# Patient Record
Sex: Male | Born: 1980 | Race: White | Hispanic: No | Smoking: Heavy tobacco smoker
Health system: Southern US, Community
[De-identification: ages and names within clinical notes are randomized; demographics above are authoritative.]

## PROBLEM LIST (undated history)

## (undated) DIAGNOSIS — Z789 Other specified health status: Secondary | ICD-10-CM

## (undated) DIAGNOSIS — K219 Gastro-esophageal reflux disease without esophagitis: Secondary | ICD-10-CM

## (undated) HISTORY — PX: NO PAST SURGERIES: SHX2092

---
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
## 2001-01-07 ENCOUNTER — Emergency Department (HOSPITAL_COMMUNITY): Admission: EM | Admit: 2001-01-07 | Discharge: 2001-01-07 | Payer: Self-pay | Admitting: Emergency Medicine

## 2001-01-12 ENCOUNTER — Emergency Department (HOSPITAL_COMMUNITY): Admission: EM | Admit: 2001-01-12 | Discharge: 2001-01-12 | Payer: Self-pay

## 2001-07-06 ENCOUNTER — Encounter: Payer: Self-pay | Admitting: Emergency Medicine

## 2001-07-06 ENCOUNTER — Emergency Department (HOSPITAL_COMMUNITY): Admission: EM | Admit: 2001-07-06 | Discharge: 2001-07-06 | Payer: Self-pay | Admitting: Emergency Medicine

## 2001-10-26 ENCOUNTER — Emergency Department (HOSPITAL_COMMUNITY): Admission: EM | Admit: 2001-10-26 | Discharge: 2001-10-26 | Payer: Self-pay | Admitting: Emergency Medicine

## 2001-11-05 ENCOUNTER — Emergency Department (HOSPITAL_COMMUNITY): Admission: EM | Admit: 2001-11-05 | Discharge: 2001-11-05 | Payer: Self-pay | Admitting: Emergency Medicine

## 2002-03-18 ENCOUNTER — Emergency Department (HOSPITAL_COMMUNITY): Admission: EM | Admit: 2002-03-18 | Discharge: 2002-03-18 | Payer: Self-pay | Admitting: Emergency Medicine

## 2002-03-18 ENCOUNTER — Encounter: Payer: Self-pay | Admitting: Emergency Medicine

## 2002-06-26 ENCOUNTER — Emergency Department (HOSPITAL_COMMUNITY): Admission: EM | Admit: 2002-06-26 | Discharge: 2002-06-26 | Payer: Self-pay

## 2003-02-24 ENCOUNTER — Emergency Department (HOSPITAL_COMMUNITY): Admission: EM | Admit: 2003-02-24 | Discharge: 2003-02-25 | Payer: Self-pay | Admitting: Emergency Medicine

## 2003-05-06 ENCOUNTER — Emergency Department (HOSPITAL_COMMUNITY): Admission: EM | Admit: 2003-05-06 | Discharge: 2003-05-06 | Payer: Self-pay | Admitting: Emergency Medicine

## 2003-07-10 ENCOUNTER — Emergency Department (HOSPITAL_COMMUNITY): Admission: EM | Admit: 2003-07-10 | Discharge: 2003-07-11 | Payer: Self-pay

## 2003-07-12 ENCOUNTER — Emergency Department (HOSPITAL_COMMUNITY): Admission: EM | Admit: 2003-07-12 | Discharge: 2003-07-12 | Payer: Self-pay | Admitting: Emergency Medicine

## 2003-07-22 ENCOUNTER — Emergency Department (HOSPITAL_COMMUNITY): Admission: EM | Admit: 2003-07-22 | Discharge: 2003-07-22 | Payer: Self-pay | Admitting: Emergency Medicine

## 2004-04-29 ENCOUNTER — Emergency Department (HOSPITAL_COMMUNITY): Admission: EM | Admit: 2004-04-29 | Discharge: 2004-04-29 | Payer: Self-pay | Admitting: Emergency Medicine

## 2005-05-04 ENCOUNTER — Emergency Department (HOSPITAL_COMMUNITY): Admission: EM | Admit: 2005-05-04 | Discharge: 2005-05-04 | Payer: Self-pay | Admitting: Emergency Medicine

## 2006-10-12 ENCOUNTER — Emergency Department (HOSPITAL_COMMUNITY): Admission: EM | Admit: 2006-10-12 | Discharge: 2006-10-12 | Payer: Self-pay | Admitting: Emergency Medicine

## 2012-04-20 ENCOUNTER — Observation Stay (HOSPITAL_COMMUNITY)
Admission: EM | Admit: 2012-04-20 | Discharge: 2012-04-21 | Disposition: A | Discharge status: Home / Self Care | Payer: Self-pay | Attending: Internal Medicine | Admitting: Internal Medicine

## 2012-04-20 ENCOUNTER — Emergency Department (HOSPITAL_COMMUNITY): Payer: Self-pay

## 2012-04-20 ENCOUNTER — Encounter (HOSPITAL_COMMUNITY): Payer: Self-pay | Admitting: Emergency Medicine

## 2012-04-20 DIAGNOSIS — K219 Gastro-esophageal reflux disease without esophagitis: Principal | ICD-10-CM

## 2012-04-20 DIAGNOSIS — F1120 Opioid dependence, uncomplicated: Secondary | ICD-10-CM | POA: Insufficient documentation

## 2012-04-20 DIAGNOSIS — Z23 Encounter for immunization: Secondary | ICD-10-CM | POA: Insufficient documentation

## 2012-04-20 DIAGNOSIS — K259 Gastric ulcer, unspecified as acute or chronic, without hemorrhage or perforation: Secondary | ICD-10-CM

## 2012-04-20 DIAGNOSIS — R109 Unspecified abdominal pain: Secondary | ICD-10-CM

## 2012-04-20 DIAGNOSIS — R1013 Epigastric pain: Secondary | ICD-10-CM | POA: Insufficient documentation

## 2012-04-20 HISTORY — DX: Other specified health status: Z78.9

## 2012-04-20 LAB — CBC WITH DIFFERENTIAL/PLATELET
Basophils Absolute: 0 10*3/uL (ref 0.0–0.1)
Eosinophils Absolute: 0 10*3/uL (ref 0.0–0.7)
Eosinophils Relative: 0 % (ref 0–5)
Lymphocytes Relative: 2 % — ABNORMAL LOW (ref 12–46)
MCV: 93.4 fL (ref 78.0–100.0)
Neutrophils Relative %: 94 % — ABNORMAL HIGH (ref 43–77)
Platelets: 243 10*3/uL (ref 150–400)
RBC: 5.27 MIL/uL (ref 4.22–5.81)
RDW: 13.2 % (ref 11.5–15.5)
WBC: 21.9 10*3/uL — ABNORMAL HIGH (ref 4.0–10.5)

## 2012-04-20 LAB — COMPREHENSIVE METABOLIC PANEL WITH GFR
ALT: 21 U/L (ref 0–53)
AST: 22 U/L (ref 0–37)
Albumin: 4.3 g/dL (ref 3.5–5.2)
Alkaline Phosphatase: 69 U/L (ref 39–117)
BUN: 18 mg/dL (ref 6–23)
CO2: 25 meq/L (ref 19–32)
Calcium: 10 mg/dL (ref 8.4–10.5)
Chloride: 99 meq/L (ref 96–112)
Creatinine, Ser: 0.78 mg/dL (ref 0.50–1.35)
GFR calc Af Amer: >90 mL/min
GFR calc non Af Amer: >90 mL/min
Glucose, Bld: 137 mg/dL — ABNORMAL HIGH (ref 70–99)
Potassium: 4 meq/L (ref 3.5–5.1)
Sodium: 141 meq/L (ref 135–145)
Total Bilirubin: 1 mg/dL (ref 0.3–1.2)
Total Protein: 7.8 g/dL (ref 6.0–8.3)

## 2012-04-20 LAB — POCT I-STAT TROPONIN I: Troponin i, poc: 0 ng/mL (ref 0.00–0.08)

## 2012-04-20 LAB — MRSA PCR SCREENING: MRSA by PCR: NEGATIVE

## 2012-04-20 MED ORDER — PANTOPRAZOLE SODIUM 40 MG IV SOLR
40.0000 mg | Freq: Once | INTRAVENOUS | Status: AC
  Administered 2012-04-20: 40 mg via INTRAVENOUS
  Filled 2012-04-20: qty 40

## 2012-04-20 MED ORDER — ONDANSETRON HCL 4 MG/2ML IJ SOLN
4.0000 mg | Freq: Once | INTRAMUSCULAR | Status: AC
  Administered 2012-04-20: 4 mg via INTRAVENOUS
  Filled 2012-04-20: qty 2

## 2012-04-20 MED ORDER — SODIUM CHLORIDE 0.9 % IV SOLN
8.0000 mg/h | INTRAVENOUS | Status: DC
  Administered 2012-04-20: 8 mg/h via INTRAVENOUS
  Filled 2012-04-20 (×3): qty 80

## 2012-04-20 MED ORDER — SODIUM CHLORIDE 0.9 % IV BOLUS (SEPSIS)
1000.0000 mL | Freq: Once | INTRAVENOUS | Status: AC
  Administered 2012-04-20: 1000 mL via INTRAVENOUS

## 2012-04-20 MED ORDER — INFLUENZA VIRUS VACC SPLIT PF IM SUSP
0.5000 mL | INTRAMUSCULAR | Status: AC
  Administered 2012-04-21: 0.5 mL via INTRAMUSCULAR
  Filled 2012-04-20: qty 0.5

## 2012-04-20 MED ORDER — METHADONE HCL 10 MG PO TABS
50.0000 mg | ORAL_TABLET | Freq: Every day | ORAL | Status: DC
  Administered 2012-04-21: 50 mg via ORAL
  Filled 2012-04-20: qty 5

## 2012-04-20 MED ORDER — HYDROMORPHONE HCL PF 1 MG/ML IJ SOLN
1.0000 mg | Freq: Once | INTRAMUSCULAR | Status: AC
  Administered 2012-04-20: 1 mg via INTRAVENOUS
  Filled 2012-04-20: qty 1

## 2012-04-20 MED ORDER — IOHEXOL 300 MG/ML  SOLN
100.0000 mL | Freq: Once | INTRAMUSCULAR | Status: AC | PRN
  Administered 2012-04-20: 100 mL via INTRAVENOUS

## 2012-04-20 MED ORDER — IOHEXOL 300 MG/ML  SOLN
20.0000 mL | INTRAMUSCULAR | Status: AC
  Administered 2012-04-20 (×2): 25 mL via ORAL

## 2012-04-20 MED ORDER — PNEUMOCOCCAL VAC POLYVALENT 25 MCG/0.5ML IJ INJ
0.5000 mL | INJECTION | INTRAMUSCULAR | Status: AC
  Administered 2012-04-21: 0.5 mL via INTRAMUSCULAR
  Filled 2012-04-20: qty 0.5

## 2012-04-20 NOTE — ED Notes (Signed)
Upper abd pain and cp  X 1 year worse since this am denies dysuria has been vomiting today he states

## 2012-04-20 NOTE — ED Provider Notes (Addendum)
History     CSN: [id number]  Arrival date & time 04/20/12  1242   First MD Initiated Contact with Patient 04/20/12 1454      No chief complaint on file.   (Consider location/radiation/quality/duration/timing/severity/associated sxs/prior treatment) Patient is a 32 y.o. male presenting with abdominal pain. The history is provided by the patient (the pt complains of abd pain and vomiting).  Abdominal Pain The primary symptoms of the illness include abdominal pain. The primary symptoms of the illness do not include fatigue or diarrhea. The current episode started 13 to 24 hours ago. The onset of the illness was sudden. The problem has not changed since onset. Associated with: nothing. The patient states that she believes she is currently not pregnant. The patient has not had a change in bowel habit. Symptoms associated with the illness do not include chills, hematuria, frequency or back pain. Significant associated medical issues do not include gallstones.    History reviewed. No pertinent past medical history.  No past surgical history on file.  No family history on file.  History  Substance Use Topics  . Smoking status: Heavy Tobacco Smoker  . Smokeless tobacco: Not on file  . Alcohol Use: Yes      Review of Systems  Constitutional: Negative for chills and fatigue.  HENT: Negative for congestion, sinus pressure and ear discharge.   Eyes: Negative for discharge.  Respiratory: Negative for cough.   Cardiovascular: Negative for chest pain.  Gastrointestinal: Positive for abdominal pain. Negative for diarrhea.  Genitourinary: Negative for frequency and hematuria.  Musculoskeletal: Negative for back pain.  Skin: Negative for rash.  Neurological: Negative for seizures and headaches.  Hematological: Negative.   Psychiatric/Behavioral: Negative for hallucinations.    Allergies  Review of patient's allergies indicates not on file.  Home Medications   Current Outpatient Rx    Name  Route  Sig  Dispense  Refill  . COUGH RELIEF PO   Oral   Take 2 tablets by mouth once.         . METHADONE HCL PO   Oral   Take 1 tablet by mouth daily.           BP 101/55  Pulse 83  Temp 98.8 F (37.1 C)  Resp 15  SpO2 100%  Physical Exam  Constitutional: He is oriented to person, place, and time. He appears well-developed.  HENT:  Head: Normocephalic and atraumatic.  Eyes: Conjunctivae normal and EOM are normal. No scleral icterus.  Neck: Neck supple. No thyromegaly present.  Cardiovascular: Normal rate and regular rhythm.  Exam reveals no gallop and no friction rub.   No murmur heard. Pulmonary/Chest: No stridor. He has no wheezes. He has no rales. He exhibits no tenderness.  Abdominal: He exhibits no distension. There is tenderness. There is no rebound.       Severe epigastric tenderness  Musculoskeletal: Normal range of motion. He exhibits no edema.  Lymphadenopathy:    He has no cervical adenopathy.  Neurological: He is oriented to person, place, and time. Coordination normal.  Skin: No rash noted. No erythema.  Psychiatric: He has a normal mood and affect. His behavior is normal.    ED Course  Procedures (including critical care time)  Labs Reviewed  CBC WITH DIFFERENTIAL - Abnormal; Notable for the following:    WBC 21.9 (*)     Neutrophils Relative 94 (*)     Neutro Abs 20.5 (*)     Lymphocytes Relative 2 (*)  Lymphs Abs 0.5 (*)     All other components within normal limits  COMPREHENSIVE METABOLIC PANEL - Abnormal; Notable for the following:    Glucose, Bld 137 (*)     All other components within normal limits  LIPASE, BLOOD  POCT I-STAT TROPONIN I   Dg Chest 2 View  04/20/2012  *RADIOLOGY REPORT*  Clinical Data: Chest and upper abdominal pain  CHEST - 2 VIEW  Comparison: None.  Findings: Normal heart size.  Clear lungs.  Mild bronchitic changes.  No pneumothorax.  No pleural effusion. T9-10 disc space is hypoplastic.  IMPRESSION: No  active cardiopulmonary disease.   Original Report Authenticated By: Jolaine Click, M.D.    Ct Abdomen Pelvis W Contrast  04/20/2012  *RADIOLOGY REPORT*  Clinical Data: Pain  CT ABDOMEN AND PELVIS WITH CONTRAST  Technique:  Multidetector CT imaging of the abdomen and pelvis was performed following the standard protocol during bolus administration of intravenous contrast.  Contrast: OMNIPAQUE IOHEXOL 300 MG/ML  SOLN  Comparison: None  Findings: Lung bases appear clear.  No pericardial or pleural effusion.  Normal appearance of the liver.  No focal liver abnormality.  The gallbladder appears within normal limits.  No biliary dilatation. The pancreas is normal.  The spleen is negative.  The adrenal glands are within normal limits.  Normal appearance of the right kidney.  The left kidney is normal.  Urinary bladder is normal.  Prostate gland and seminal vesicles appear normal.  The abdominal aorta has a normal caliber.  No aneurysm.  There is no upper abdominal adenopathy identified.  There is no pelvic or inguinal adenopathy.  The stomach is normal. The small bowel loops are unremarkable.  No obstruction.  The appendix is difficult to visualize separate from the right lower quadrant bowel loops.  No secondary signs of acute appendicitis.  Normal appearance of the colon.  No free fluid or abnormal fluid collection identified.  Review of the visualized osseous structures is unremarkable.  IMPRESSION:  1.  No acute findings identified within the abdomen or pelvis. 2.  Nonvisualization of the appendix but no secondary signs of acute appendicitis.   Original Report Authenticated By: Signa Kell, M.D.      1. Abdominal pain       MDM      Date: 04/20/2012  Rate: 101  Rhythm: sinus tachycardia  QRS Axis: normal  Intervals: normal  ST/T Wave abnormalities: normal  Conduction Disutrbances:none  Narrative Interpretation:   Old EKG Reviewed: none available       Benny Lennert, MD 04/20/12  1923  Benny Lennert, MD 04/20/12 1924

## 2012-04-20 NOTE — Progress Notes (Signed)
  Pt admitted to the unit. Pt is stable, alert and oriented per baseline. Oriented to room, staff, and call bell. Educated to call for any assistance. Bed in lowest position, call bell within reach- will continue to monitor. 

## 2012-04-20 NOTE — H&P (Signed)
[facility] History and Physical  SHEM PLEMMONS ZOX:[id number] DOB: 12/10/1980 DOA: 04/20/2012  Referring physician: ED PCP: Pcp Not In System  Specialists: None  Chief Complaint: Abdominal pain  HPI: Walter Miller is a 32 y.o. male who presents to the ED with c/o abdominal pain, located in his epigastric area.  Worst first thing in the morning with severe burning sensation up all the way to his throat.  Swallowing first thing in the morning makes it significantly worse as does lying down.  Sitting up seems to make the burning sensation better.  This pain has been going on for the past 1 year but significantly worse today.  In the ED, ct scan of his abdomen was negative, ED felt however that he should be admitted for concern of gastric ulcer and acid reflux pain control.  Review of Systems: 12 systems reviewed and otherwise negative  History reviewed. No pertinent past medical history. No past surgical history on file. Social History:  reports that he has been smoking.  He does not have any smokeless tobacco history on file. He reports that he drinks alcohol. His drug history not on file. Is on chronic methadone from methadone clinic  Not on File  No family history on file.   Prior to Admission medications   Medication Sig Start Date End Date Taking? Authorizing Provider  Dextromethorphan HBr (COUGH RELIEF PO) Take 2 tablets by mouth once.   Yes Historical Provider, MD  METHADONE HCL PO Take 1 tablet by mouth daily.   Yes Historical Provider, MD   Physical Exam: Filed Vitals:   04/20/12 1645 04/20/12 1700 04/20/12 1830 04/20/12 1900  BP: 113/49 101/67 131/65 101/55  Pulse: 95 85 80 83  Temp:      Resp: 19 18 16 15   SpO2: 100% 97% 100% 100%    General:  NAD, resting comfortably in bed Eyes: PEERLA EOMI ENT: mucous membranes moist Neck: supple w/o JVD Cardiovascular: RRR w/o MRG Respiratory: CTA B Abdomen: soft, some epigastric tenderness but no guarding, no  rebound, nd, bs+ Skin: no rash nor lesion Musculoskeletal: MAE, full ROM all 4 extremities Psychiatric: normal tone and affect Neurologic: AAOx3, grossly non-focal  Labs on Admission:  Basic Metabolic Panel:  Lab 04/20/12 8119  NA 141  K 4.0  CL 99  CO2 25  GLUCOSE 137*  BUN 18  CREATININE 0.78  CALCIUM 10.0  MG --  PHOS --   Liver Function Tests:  Lab 04/20/12 1249  AST 22  ALT 21  ALKPHOS 69  BILITOT 1.0  PROT 7.8  ALBUMIN 4.3    Lab 04/20/12 1249  LIPASE 41  AMYLASE --   No results found for this basename: AMMONIA:5 in the last 168 hours CBC:  Lab 04/20/12 1249  WBC 21.9*  NEUTROABS 20.5*  HGB 16.9  HCT 49.2  MCV 93.4  PLT 243   Cardiac Enzymes: No results found for this basename: CKTOTAL:5,CKMB:5,CKMBINDEX:5,TROPONINI:5 in the last 168 hours  BNP (last 3 results) No results found for this basename: PROBNP:3 in the last 8760 hours CBG: No results found for this basename: GLUCAP:5 in the last 168 hours  Radiological Exams on Admission: Dg Chest 2 View  04/20/2012  *RADIOLOGY REPORT*  Clinical Data: Chest and upper abdominal pain  CHEST - 2 VIEW  Comparison: None.  Findings: Normal heart size.  Clear lungs.  Mild bronchitic changes.  No pneumothorax.  No pleural effusion. T9-10 disc space is hypoplastic.  IMPRESSION: No active cardiopulmonary disease.  Original Report Authenticated By: Jolaine Click, MillerD.    Ct Abdomen Pelvis W Contrast  04/20/2012  *RADIOLOGY REPORT*  Clinical Data: Pain  CT ABDOMEN AND PELVIS WITH CONTRAST  Technique:  Multidetector CT imaging of the abdomen and pelvis was performed following the standard protocol during bolus administration of intravenous contrast.  Contrast: OMNIPAQUE IOHEXOL 300 MG/ML  SOLN  Comparison: None  Findings: Lung bases appear clear.  No pericardial or pleural effusion.  Normal appearance of the liver.  No focal liver abnormality.  The gallbladder appears within normal limits.  No biliary dilatation.  The pancreas is normal.  The spleen is negative.  The adrenal glands are within normal limits.  Normal appearance of the right kidney.  The left kidney is normal.  Urinary bladder is normal.  Prostate gland and seminal vesicles appear normal.  The abdominal aorta has a normal caliber.  No aneurysm.  There is no upper abdominal adenopathy identified.  There is no pelvic or inguinal adenopathy.  The stomach is normal. The small bowel loops are unremarkable.  No obstruction.  The appendix is difficult to visualize separate from the right lower quadrant bowel loops.  No secondary signs of acute appendicitis.  Normal appearance of the colon.  No free fluid or abnormal fluid collection identified.  Review of the visualized osseous structures is unremarkable.  IMPRESSION:  1.  No acute findings identified within the abdomen or pelvis. 2.  Nonvisualization of the appendix but no secondary signs of acute appendicitis.   Original Report Authenticated By: Signa Kell, MillerD.     EKG: Independently reviewed.  Assessment/Plan Principal Problem:  *Gastric ulcer Active Problems:  GERD (gastroesophageal reflux disease)   1. GERD - classic GERD symptoms, seems to have improved somewhat with 40 protonix IV, will go ahead and put on PPI GTT for now to try and get pain under control since he isnt really responding much to the dilaudid probably because he has a high tolerance for narcotics (on methadone). 2. Gastric ulcer - again putting on PPI GTT, likely needs long term PPI, may need GI consultation for endoscopy.  Getting all of this set up as an outpatient is likely to be his biggest issue since he dosent have insurance.    Code Status: Full Code (must indicate code status--if unknown or must be presumed, indicate so) Family Communication: No family in room (indicate person spoken with, if applicable, with phone number if by telephone) Disposition Plan: Admit to obs (indicate anticipated LOS)  Time spent: 50  min  Walter Tye M. [facility] Pager [phone]  If 7PM-7AM, please contact night-coverage [email or web address] Password [facility] 04/20/2012, 8:38 PM

## 2012-04-21 LAB — CBC
HCT: 41 % (ref 39.0–52.0)
Hemoglobin: 14.2 g/dL (ref 13.0–17.0)
WBC: 11 10*3/uL — ABNORMAL HIGH (ref 4.0–10.5)

## 2012-04-21 LAB — BASIC METABOLIC PANEL
BUN: 15 mg/dL (ref 6–23)
CO2: 27 mEq/L (ref 19–32)
Chloride: 103 mEq/L (ref 96–112)
GFR calc Af Amer: >90 mL/min (ref >90)
Glucose, Bld: 101 mg/dL — ABNORMAL HIGH (ref 70–99)
Potassium: 3.5 mEq/L (ref 3.5–5.1)

## 2012-04-21 MED ORDER — HYDROMORPHONE HCL PF 1 MG/ML IJ SOLN
1.0000 mg | INTRAMUSCULAR | Status: DC | PRN
  Administered 2012-04-21 (×2): 1 mg via INTRAVENOUS
  Filled 2012-04-21 (×2): qty 1

## 2012-04-21 MED ORDER — PANTOPRAZOLE SODIUM 40 MG PO TBEC: 40.0000 mg | DELAYED_RELEASE_TABLET | Freq: Every day | ORAL | Status: DC

## 2012-04-21 NOTE — Progress Notes (Signed)
Nsg Discharge Note  Admit Date:  04/20/2012 Discharge date: 04/21/2012   Phylliss Bob to be D/C'd Home per MD order.  AVS completed.  Copy for chart, and copy for patient signed, and dated. Patient/caregiver able to verbalize understanding.  Discharge Medication:  Ermias, Tomeo  Home Medication Instructions AOZ:[id number]   Printed on:04/21/12 1415  Medication Information                    METHADONE HCL PO Take 1 tablet by mouth daily.           pantoprazole (PROTONIX) 40 MG tablet Take 1 tablet (40 mg total) by mouth daily.             Discharge Assessment: Filed Vitals:   04/21/12 0541  BP: 98/62  Pulse: 72  Temp: 99 F (37.2 C)  Resp: 20   Skin clean, dry and intact without evidence of skin break down, no evidence of skin tears noted. IV catheter discontinued intact. Site without signs and symptoms of complications - no redness or edema noted at insertion site, patient denies c/o pain - only slight tenderness at site.  Dressing with slight pressure applied.  D/c Instructions-Education: Discharge instructions given to patient/family with verbalized understanding. D/c education completed with patient/family including follow up instructions, medication list, d/c activities limitations if indicated, with other d/c instructions as indicated by MD - patient able to verbalize understanding, all questions fully answered. Patient instructed to return to ED, call 911, or call MD for any changes in condition.  Patient escorted via WC, and D/C home via private auto.  Bethel Gaglio Consuella Lose, RN 04/21/2012 2:15 PM

## 2012-04-21 NOTE — Discharge Summary (Signed)
Physician Discharge Summary  Walter Miller XLK:[id number] DOB: [birth year] DOA: 04/20/2012  PCP: [facility]  Admit date: 04/20/2012 Discharge date: 04/21/2012  Time spent: 40 minutes  Recommendations for Outpatient Follow-up:  1. Followup with primary care physician methadone clinic  Discharge Diagnoses:  Principal Problem:  *Gastric ulcer Active Problems:  GERD (gastroesophageal reflux disease)   Discharge Condition: Stable  Diet recommendation: Regular food, avoid acidic food  Filed Weights   04/20/12 2251  Weight: 89.2 kg (196 lb 10.4 oz)    History of present illness:  Walter Miller is a 32 y.o. male who presents to the ED with c/o abdominal pain, located in his epigastric area. Worst first thing in the morning with severe burning sensation up all the way to his throat. Swallowing first thing in the morning makes it significantly worse as does lying down. Sitting up seems to make the burning sensation better. This pain has been going on for the past 1 year but significantly worse today.  In the ED, ct scan of his abdomen was negative, ED felt however that he should be admitted for concern of gastric ulcer and acid reflux pain control.   Hospital Course:   1. Abdominal pain: Patient admitted to the hospital because of classic GERD symptoms, patient did have abdominal pain, dyspepsia and heartburn as well as sour taste in his mouth in the morning. There was concern about gastric ulcer, patient denies any hematemesis, denies any colored stools. I curb sided [place] [facility] and Dr. Christella Hartigan recommended Protonix once a day for the foreseeable future. Patient should followup with primary care physician, if he continues to have GERD symptoms he might need evaluation by GI as outpatient with scope.  2. GERD: As discussed above, Protonix by mouth daily, avoid acidic food and NSAIDs. Follow up with primary care physician, potential referral to GI as outpatient if  continues to have symptoms.  3. Narcotics dependence: Patient is on methadone, he falls with [facility], his and 55 mg per him, he is on methadone to keep him off of street opioids. He is been on methadone for 2 years, I wonder if it is the time to wean him off of the methadone. He follows with methadone clinic.  Procedures:  None  Consultations:  None  Discharge Exam: Filed Vitals:   04/20/12 2048 04/20/12 2114 04/20/12 2251 04/21/12 0541  BP: 115/48 109/63 123/66 98/62  Pulse: 80 92 68 72  Temp: 99 F (37.2 C) 99.4 F (37.4 C) 99.5 F (37.5 C) 99 F (37.2 C)  TempSrc: Oral Oral Oral Oral  Resp: 19 20 18 20   Height:   5\' 10"  (1.778 m)   Weight:   89.2 kg (196 lb 10.4 oz)   SpO2: 100% 100% 100% 100%   General: Alert and awake, oriented x3, not in any acute distress. HEENT: anicteric sclera, pupils reactive to light and accommodation, EOMI CVS: S1-S2 clear, no murmur rubs or gallops Chest: clear to auscultation bilaterally, no wheezing, rales or rhonchi Abdomen: soft nontender, nondistended, normal bowel sounds, no organomegaly Extremities: no cyanosis, clubbing or edema noted bilaterally Neuro: Cranial nerves II-XII intact, no focal neurological deficits  Discharge Instructions     Medication List     As of 04/21/2012 11:02 AM    STOP taking these medications         COUGH RELIEF PO      TAKE these medications         METHADONE HCL PO  Take 1 tablet by mouth daily.      pantoprazole 40 MG tablet   Commonly known as: PROTONIX   Take 1 tablet (40 mg total) by mouth daily.          The results of significant diagnostics from this hospitalization (including imaging, microbiology, ancillary and laboratory) are listed below for reference.    Significant Diagnostic Studies: Dg Chest 2 View  04/20/2012  *RADIOLOGY REPORT*  Clinical Data: Chest and upper abdominal pain  CHEST - 2 VIEW  Comparison: None.  Findings: Normal heart size.  Clear lungs.   Mild bronchitic changes.  No pneumothorax.  No pleural effusion. T9-10 disc space is hypoplastic.  IMPRESSION: No active cardiopulmonary disease.   Original Report Authenticated By: Jolaine Click, M.D.    Ct Abdomen Pelvis W Contrast  04/20/2012  *RADIOLOGY REPORT*  Clinical Data: Pain  CT ABDOMEN AND PELVIS WITH CONTRAST  Technique:  Multidetector CT imaging of the abdomen and pelvis was performed following the standard protocol during bolus administration of intravenous contrast.  Contrast: OMNIPAQUE IOHEXOL 300 MG/ML  SOLN  Comparison: None  Findings: Lung bases appear clear.  No pericardial or pleural effusion.  Normal appearance of the liver.  No focal liver abnormality.  The gallbladder appears within normal limits.  No biliary dilatation. The pancreas is normal.  The spleen is negative.  The adrenal glands are within normal limits.  Normal appearance of the right kidney.  The left kidney is normal.  Urinary bladder is normal.  Prostate gland and seminal vesicles appear normal.  The abdominal aorta has a normal caliber.  No aneurysm.  There is no upper abdominal adenopathy identified.  There is no pelvic or inguinal adenopathy.  The stomach is normal. The small bowel loops are unremarkable.  No obstruction.  The appendix is difficult to visualize separate from the right lower quadrant bowel loops.  No secondary signs of acute appendicitis.  Normal appearance of the colon.  No free fluid or abnormal fluid collection identified.  Review of the visualized osseous structures is unremarkable.  IMPRESSION:  1.  No acute findings identified within the abdomen or pelvis. 2.  Nonvisualization of the appendix but no secondary signs of acute appendicitis.   Original Report Authenticated By: Signa Kell, M.D.     Microbiology: Recent Results (from the past 240 hour(s))  MRSA PCR SCREENING     Status: Normal   Collection Time   04/20/12 10:26 PM      Component Value Range Status Comment   MRSA by PCR  NEGATIVE  NEGATIVE Final      Labs: Basic Metabolic Panel:  Lab 04/21/12 1610 04/20/12 1249  NA 139 141  K 3.5 4.0  CL 103 99  CO2 27 25  GLUCOSE 101* 137*  BUN 15 18  CREATININE 0.89 0.78  CALCIUM 8.8 10.0  MG -- --  PHOS -- --   Liver Function Tests:  Lab 04/20/12 1249  AST 22  ALT 21  ALKPHOS 69  BILITOT 1.0  PROT 7.8  ALBUMIN 4.3    Lab 04/20/12 1249  LIPASE 41  AMYLASE --   No results found for this basename: AMMONIA:5 in the last 168 hours CBC:  Lab 04/21/12 0620 04/20/12 1249  WBC 11.0* 21.9*  NEUTROABS -- 20.5*  HGB 14.2 16.9  HCT 41.0 49.2  MCV 93.2 93.4  PLT 199 243   Cardiac Enzymes: No results found for this basename: CKTOTAL:5,CKMB:5,CKMBINDEX:5,TROPONINI:5 in the last 168 hours BNP: BNP (last 3  results) No results found for this basename: PROBNP:3 in the last 8760 hours CBG: No results found for this basename: GLUCAP:5 in the last 168 hours     Signed:  Marshawn Normoyle A  [facility] 04/21/2012, 11:02 AM

## 2013-01-18 ENCOUNTER — Emergency Department (HOSPITAL_COMMUNITY): Payer: No Typology Code available for payment source

## 2013-01-18 ENCOUNTER — Encounter (HOSPITAL_COMMUNITY): Payer: Self-pay | Admitting: Emergency Medicine

## 2013-01-18 ENCOUNTER — Emergency Department (HOSPITAL_COMMUNITY)
Admission: EM | Admit: 2013-01-18 | Discharge: 2013-01-18 | Disposition: A | Discharge status: Home / Self Care | Payer: No Typology Code available for payment source | Attending: Emergency Medicine | Admitting: Emergency Medicine

## 2013-01-18 DIAGNOSIS — M5432 Sciatica, left side: Secondary | ICD-10-CM

## 2013-01-18 DIAGNOSIS — Y92410 Unspecified street and highway as the place of occurrence of the external cause: Secondary | ICD-10-CM | POA: Insufficient documentation

## 2013-01-18 DIAGNOSIS — R51 Headache: Secondary | ICD-10-CM | POA: Insufficient documentation

## 2013-01-18 DIAGNOSIS — Y9389 Activity, other specified: Secondary | ICD-10-CM | POA: Insufficient documentation

## 2013-01-18 DIAGNOSIS — F17200 Nicotine dependence, unspecified, uncomplicated: Secondary | ICD-10-CM | POA: Insufficient documentation

## 2013-01-18 DIAGNOSIS — M5430 Sciatica, unspecified side: Secondary | ICD-10-CM | POA: Insufficient documentation

## 2013-01-18 DIAGNOSIS — M549 Dorsalgia, unspecified: Secondary | ICD-10-CM

## 2013-01-18 DIAGNOSIS — R209 Unspecified disturbances of skin sensation: Secondary | ICD-10-CM | POA: Insufficient documentation

## 2013-01-18 DIAGNOSIS — Z79899 Other long term (current) drug therapy: Secondary | ICD-10-CM | POA: Insufficient documentation

## 2013-01-18 MED ORDER — KETOROLAC TROMETHAMINE 60 MG/2ML IM SOLN
60.0000 mg | Freq: Once | INTRAMUSCULAR | Status: AC
  Administered 2013-01-18: 60 mg via INTRAMUSCULAR
  Filled 2013-01-18: qty 2

## 2013-01-18 MED ORDER — PREDNISONE 20 MG PO TABS: 40.0000 mg | ORAL_TABLET | Freq: Every day | ORAL | Status: DC

## 2013-01-18 NOTE — ED Notes (Signed)
Seen at [facility] treatment. Currently on Methadone.
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
## 2013-01-20 NOTE — ED Provider Notes (Signed)
Medical screening examination/treatment/procedure(s) were performed by non-physician practitioner and as supervising physician I was immediately available for consultation/collaboration.    Christopher J. Pollina, MD 01/20/13 1556 

## 2013-12-22 ENCOUNTER — Encounter (HOSPITAL_COMMUNITY): Payer: Self-pay | Admitting: Emergency Medicine

## 2013-12-22 ENCOUNTER — Emergency Department (HOSPITAL_COMMUNITY)
Admission: EM | Admit: 2013-12-22 | Discharge: 2013-12-22 | Disposition: A | Discharge status: Home / Self Care | Payer: No Typology Code available for payment source | Attending: Emergency Medicine | Admitting: Emergency Medicine

## 2013-12-22 DIAGNOSIS — R000 Tachycardia, unspecified: Secondary | ICD-10-CM | POA: Insufficient documentation

## 2013-12-22 DIAGNOSIS — F17200 Nicotine dependence, unspecified, uncomplicated: Secondary | ICD-10-CM | POA: Insufficient documentation

## 2013-12-22 DIAGNOSIS — J36 Peritonsillar abscess: Secondary | ICD-10-CM

## 2013-12-22 DIAGNOSIS — R509 Fever, unspecified: Secondary | ICD-10-CM | POA: Insufficient documentation

## 2013-12-22 LAB — RAPID STREP SCREEN (MED CTR MEBANE ONLY): Streptococcus, Group A Screen (Direct): POSITIVE — AB

## 2013-12-22 MED ORDER — OXYCODONE-ACETAMINOPHEN 5-325 MG PO TABS: 1.0000 | ORAL_TABLET | ORAL | Status: DC | PRN

## 2013-12-22 MED ORDER — SODIUM CHLORIDE 0.9 % IV BOLUS (SEPSIS)
1000.0000 mL | Freq: Once | INTRAVENOUS | Status: AC
  Administered 2013-12-22: 1000 mL via INTRAVENOUS

## 2013-12-22 MED ORDER — CLINDAMYCIN HCL 150 MG PO CAPS: 300.0000 mg | ORAL_CAPSULE | Freq: Three times a day (TID) | ORAL | Status: DC

## 2013-12-22 MED ORDER — IBUPROFEN 100 MG/5ML PO SUSP
600.0000 mg | Freq: Once | ORAL | Status: AC
  Administered 2013-12-22: 600 mg via ORAL
  Filled 2013-12-22 (×2): qty 30

## 2013-12-22 MED ORDER — DEXAMETHASONE SODIUM PHOSPHATE 10 MG/ML IJ SOLN
10.0000 mg | Freq: Once | INTRAMUSCULAR | Status: AC
  Administered 2013-12-22: 10 mg via INTRAVENOUS
  Filled 2013-12-22: qty 1

## 2013-12-22 MED ORDER — CLINDAMYCIN PHOSPHATE 600 MG/50ML IV SOLN
600.0000 mg | Freq: Once | INTRAVENOUS | Status: AC
  Administered 2013-12-22: 600 mg via INTRAVENOUS
  Filled 2013-12-22: qty 50

## 2013-12-22 NOTE — ED Notes (Signed)
MD at bedside. EDPA PRESENT TO RE E VALUATE

## 2013-12-22 NOTE — ED Notes (Addendum)
Pt reports with sore throat, and fever x 3 days, sts unable to swallow today

## 2013-12-22 NOTE — ED Provider Notes (Signed)
CSN: [id number]     Arrival date & time 12/22/13  1101 History   First MD Initiated Contact with Patient 12/22/13 1149     Chief Complaint  Patient presents with  . Sore Throat  . Fever   (Consider location/radiation/quality/duration/timing/severity/associated sxs/prior Treatment) HPI Walter Miller is a 33 yo presenting to the ED with c/o sore throat x 3 days.  He reports his children were sick with URI symptoms last week but no one had a sore throat.  It began as a mild sore throat, 3 days ago but has progressively become more painful.  He is also having trouble eating and drinking because of difficulty swallowing.  He describes the pain as constant soreness and rates it as 10/10.  He reports a subjective fever at home accompanied with feeling and hot and cold chills.  He denies cough, shortness of breath, difficulty breathing, abd pain, nausea or vomiting.   Past Medical History  Diagnosis Date  . No pertinent past medical history    Past Surgical History  Procedure Laterality Date  . No past surgeries     No family history on file. History  Substance Use Topics  . Smoking status: Heavy Tobacco Smoker -- 1.00 packs/day  . Smokeless tobacco: Not on file  . Alcohol Use: No    Review of Systems  Constitutional: Positive for fever and chills.  HENT: Positive for sore throat and voice change.   Eyes: Negative for visual disturbance.  Respiratory: Negative for cough and shortness of breath.   Cardiovascular: Negative for chest pain and leg swelling.  Gastrointestinal: Negative for nausea, vomiting and diarrhea.  Genitourinary: Negative for dysuria.  Musculoskeletal: Negative for myalgias.  Skin: Negative for rash.  Neurological: Negative for weakness, numbness and headaches.      Allergies  Review of patient's allergies indicates no known allergies.  Home Medications   Prior to Admission medications   Medication Sig Start Date End Date Taking? Authorizing Provider    BUPRENORPHINE HCL SL Place 12 mg under the tongue daily.   Yes Historical Provider, MD  Phenyleph-Doxylamine-DM-APAP (ALKA SELTZER PLUS PO) Take 1 tablet by mouth every 6 (six) hours as needed (cold symptoms).   Yes Historical Provider, MD   BP 127/73  Pulse 114  Temp(Src) 100.6 F (38.1 C) (Oral)  Resp 16  Ht  (1.753 m)  Wt 195 lb (88.451 kg)  BMI 28.78 kg/m2  SpO2 97% Physical Exam  Nursing note and vitals reviewed. Constitutional: He appears well-developed and well-nourished. No distress.  HENT:  Head: Normocephalic and atraumatic.  Mouth/Throat: Oropharyngeal exudate, posterior oropharyngeal edema, posterior oropharyngeal erythema and tonsillar abscesses present.    Eyes: Conjunctivae are normal.  Neck: Erythema present. No thyromegaly present.    Cardiovascular: Regular rhythm and intact distal pulses.  Tachycardia present.  Exam reveals no gallop and no friction rub.   No murmur heard. Pulmonary/Chest: Effort normal and breath sounds normal. No respiratory distress. He has no decreased breath sounds. He has no wheezes. He has no rhonchi. He has no rales. He exhibits no tenderness.  Abdominal: Soft. There is no tenderness.  Musculoskeletal: He exhibits no tenderness.  Lymphadenopathy:       Head (right side): Tonsillar adenopathy present.       Head (left side): Tonsillar adenopathy present.    He has cervical adenopathy.  Neurological: He is alert.  Skin: Skin is warm and dry. No rash noted. He is not diaphoretic.  Psychiatric: He has a normal mood  and affect.    ED Course  Procedures (including critical care time) Labs Review Labs Reviewed  RAPID STREP SCREEN - Abnormal; Notable for the following:    Streptococcus, Group A Screen (Direct) POSITIVE (*)    All other components within normal limits   Imaging Review No results found.   EKG Interpretation None      MDM   Final diagnoses:  Peritonsillar abscess   33 yo male with fever, sore throat,  positive strep, bilat enlarged tonsils, tonsillar exudate, cervical lymphadenopathy, & dysphagia.  Left significantly larger with uvula deviation. Strep swab positive. Concern for peritonsillar abscess. Case discussed with Dr. Denton Lank. Treated in the ED with IV NS bolus, steroids, NSAIDs, Pain medication and IV clindamycin. ENT consulted and will see in office tomorrow am.  Fever and tachycardia resolved in ED, pt able tolerate POs with intact airway.  Discharge instructions include prescription for abx, pain meds and strict instructions to call in am follow-up appt with ENT as they are expecting him.  Pt aware of plan and in agreement.  Return precautions provided.   Filed Vitals:   12/22/13 1141 12/22/13 1241 12/22/13 1317 12/22/13 1619  BP:   132/67 120/59  Pulse:  102 111 85  Temp:   101.3 F (38.5 C) 98.7 F (37.1 C)  TempSrc:   Oral Oral  Resp:   21 16  Height:  (1.753 m)     Weight: 195 lb (88.451 kg)     SpO2:   100% 100%   Meds given in ED:  Medications  sodium chloride 0.9 % bolus 1,000 mL (0 mLs Intravenous Stopped 12/22/13 1421)  clindamycin (CLEOCIN) IVPB 600 mg (0 mg Intravenous Stopped 12/22/13 1421)  dexamethasone (DECADRON) injection 10 mg (10 mg Intravenous Given 12/22/13 1243)  sodium chloride 0.9 % bolus 1,000 mL (0 mLs Intravenous Stopped 12/22/13 1622)  ibuprofen (ADVIL,MOTRIN) 100 MG/5ML suspension 600 mg (600 mg Oral Given 12/22/13 1420)    Discharge Medication List as of 12/22/2013  3:38 PM    START taking these medications   Details  clindamycin (CLEOCIN) 150 MG capsule Take 2 capsules (300 mg total) by mouth 3 (three) times daily. May dispense as  capsules, Starting 12/22/2013, Until Discontinued, Print    oxyCODONE-acetaminophen (PERCOCET/ROXICET) 5-325 MG per tablet Take 1-2 tablets by mouth every 4 (four) hours as needed for moderate pain or severe pain., Starting 12/22/2013, Until Discontinued, Print           Harle Battiest, NP 12/25/13  1141

## 2013-12-22 NOTE — Discharge Instructions (Signed)
Be sure to call the ENT office tomorrow starting at 8:30 am.  Please tell them Dr. Jenne Pane was consulted regarding your peritonsillar abscess and is expecting you in the office on Monday.  Don't hesitate to come back if you have any worsening or concerning symptoms before then.  SEEK MEDICAL CARE IF:  You have increased pain, swelling, redness, or drainage in your throat.  You develop signs of infection such as dizziness, headache, lethargy, or generalized feelings of illness.  You have difficulty breathing, swallowing or eating.  You show signs of becoming dehydrated (lightheadedness when standing, decreased urine output, a fast heart rate, or dry mouth and mucous membranes). SEEK IMMEDIATE MEDICAL CARE IF:  You have a fever.  You are coughing up or vomiting blood.  You develop more severe throat pain uncontrolled with medicines or you start to drool.  You develop difficulty breathing, talking, or find it easier to breathe while leaning forward

## 2013-12-26 NOTE — ED Provider Notes (Signed)
Medical screening examination/treatment/procedure(s) were conducted as a shared visit with non-physician practitioner(s) and myself.  I personally evaluated the patient during the encounter.  Pt c/o sore throat for the past few days. Worse on left. Is able to swallow. No trouble breathing. On exam, suspect early left pta. +exudative pharyngitis. No stridor, no increased wob.  Handling secretions and swallowing liquids. Discussed w ent, Dr Jenne Pane - who indicates he will see in office tomorrow AM.    Suzi Roots, MD 12/26/13 [phone]

## 2014-05-22 ENCOUNTER — Emergency Department (HOSPITAL_COMMUNITY): Payer: Self-pay

## 2014-05-22 ENCOUNTER — Encounter (HOSPITAL_COMMUNITY): Payer: Self-pay | Admitting: Adult Health

## 2014-05-22 ENCOUNTER — Emergency Department (HOSPITAL_COMMUNITY)
Admission: EM | Admit: 2014-05-22 | Discharge: 2014-05-23 | Disposition: A | Discharge status: Home / Self Care | Payer: Self-pay | Attending: Emergency Medicine | Admitting: Emergency Medicine

## 2014-05-22 DIAGNOSIS — Y9389 Activity, other specified: Secondary | ICD-10-CM | POA: Insufficient documentation

## 2014-05-22 DIAGNOSIS — Y9289 Other specified places as the place of occurrence of the external cause: Secondary | ICD-10-CM | POA: Insufficient documentation

## 2014-05-22 DIAGNOSIS — R451 Restlessness and agitation: Secondary | ICD-10-CM | POA: Insufficient documentation

## 2014-05-22 DIAGNOSIS — S61511A Laceration without foreign body of right wrist, initial encounter: Secondary | ICD-10-CM | POA: Insufficient documentation

## 2014-05-22 DIAGNOSIS — W25XXXA Contact with sharp glass, initial encounter: Secondary | ICD-10-CM | POA: Insufficient documentation

## 2014-05-22 DIAGNOSIS — Z79899 Other long term (current) drug therapy: Secondary | ICD-10-CM | POA: Insufficient documentation

## 2014-05-22 DIAGNOSIS — Z720 Tobacco use: Secondary | ICD-10-CM | POA: Insufficient documentation

## 2014-05-22 DIAGNOSIS — Z792 Long term (current) use of antibiotics: Secondary | ICD-10-CM | POA: Insufficient documentation

## 2014-05-22 DIAGNOSIS — IMO0002 Reserved for concepts with insufficient information to code with codable children: Secondary | ICD-10-CM

## 2014-05-22 DIAGNOSIS — Y998 Other external cause status: Secondary | ICD-10-CM | POA: Insufficient documentation

## 2014-05-22 MED ORDER — LIDOCAINE HCL 2 % IJ SOLN
15.0000 mL | Freq: Once | INTRAMUSCULAR | Status: DC
  Filled 2014-05-22: qty 20

## 2014-05-22 MED ORDER — LIDOCAINE-EPINEPHRINE (PF) 2 %-1:200000 IJ SOLN
10.0000 mL | Freq: Once | INTRAMUSCULAR | Status: AC
  Administered 2014-05-22: 10 mL

## 2014-05-22 MED ORDER — LIDOCAINE-EPINEPHRINE (PF) 2 %-1:200000 IJ SOLN
20.0000 mL | Freq: Once | INTRAMUSCULAR | Status: DC
  Filled 2014-05-22: qty 20

## 2014-05-22 NOTE — ED Notes (Signed)
Pt and family becoming more anxious about xray results and having lac sutured. PA Forcucci on phone with Radiologist in regards to xray results.

## 2014-05-22 NOTE — ED Provider Notes (Signed)
CSN: [id number]     Arrival date & time 05/22/14  2036 History  This chart was scribed for Eben Burow, PA-C working with Ethelda Chick, MD by Evon Slack, ED Scribe. This patient was seen in room TR11C/TR11C and the patient's care was started at 9:45 PM.    Chief Complaint  Patient presents with  . Laceration   Patient is a 34 y.o. male presenting with skin laceration. The history is provided by the patient. No language interpreter was used.  Laceration  HPI Comments: JARMEL LINHARDT is a 34 y.o. male who presents to the Emergency Department complaining of new laceration to the right wrist onset 3 HR PTA. Pt states that he was playing and his hand with through the fist tank. Pt states that he is having difficulty moving the right pinky finger and states that it is slightly numb. Pt state that the bleeding is not controlled. Pt doesn't report any other symptoms. Pt states that he thinks that his tetanus is UTD. Patient does admit to drinking several shots before coming here.  Past Medical History  Diagnosis Date  . No pertinent past medical history    Past Surgical History  Procedure Laterality Date  . No past surgeries     History reviewed. No pertinent family history. History  Substance Use Topics  . Smoking status: Heavy Tobacco Smoker -- 1.00 packs/day  . Smokeless tobacco: Not on file  . Alcohol Use: No    Review of Systems  Skin: Positive for wound.  Neurological: Positive for numbness.  All other systems reviewed and are negative.     Allergies  Review of patient's allergies indicates no known allergies.  Home Medications   Prior to Admission medications   Medication Sig Start Date End Date Taking? Authorizing Provider  BUPRENORPHINE HCL SL Place 12 mg under the tongue daily.    Historical Provider, MD  clindamycin (CLEOCIN) 150 MG capsule Take 2 capsules (300 mg total) by mouth 3 (three) times daily. May dispense as  capsules 12/22/13   Harle Battiest, NP  oxyCODONE-acetaminophen (PERCOCET/ROXICET) 5-325 MG per tablet Take 1-2 tablets by mouth every 4 (four) hours as needed for moderate pain or severe pain. 12/22/13   Harle Battiest, NP  Phenyleph-Doxylamine-DM-APAP (ALKA SELTZER PLUS PO) Take 1 tablet by mouth every 6 (six) hours as needed (cold symptoms).    Historical Provider, MD   BP 150/93 mmHg  Pulse 94  Temp(Src) 98.6 F (37 C) (Oral)  Resp 22  Ht  (1.753 m)  Wt 180 lb (81.647 kg)  BMI 26.57 kg/m2  SpO2 97%   Physical Exam  Constitutional: He is oriented to person, place, and time. He appears well-developed and well-nourished. No distress.  HENT:  Head: Normocephalic and atraumatic.  Eyes: Conjunctivae and EOM are normal.  Neck: Neck supple. No tracheal deviation present.  Cardiovascular: Normal rate, regular rhythm, normal heart sounds and intact distal pulses.  Exam reveals no gallop and no friction rub.   No murmur heard. Pulmonary/Chest: Effort normal and breath sounds normal. No respiratory distress. He has no wheezes. He has no rales. He exhibits no tenderness.  Musculoskeletal: Normal range of motion.       Right wrist: He exhibits tenderness and laceration. He exhibits normal range of motion, no bony tenderness, no swelling, no effusion, no crepitus and no deformity.       Arms: Patient does demonstrate full flexion and extension of all 5 fingers of the right hand.  Neurological: He is alert and oriented to person, place, and time.  Skin: Skin is warm and dry.  Psychiatric: His affect is labile. His speech is rapid and/or pressured. He is agitated.  Nursing note and vitals reviewed.   ED Course  Procedures (including critical care time) DIAGNOSTIC STUDIES: Oxygen Saturation is 97% on RA, normal by my interpretation.    COORDINATION OF CARE: 9:52 PM-Discussed treatment plan with pt at bedside and pt agreed to plan.    LACERATION REPAIR Performed by: Eben BurowForcucci,  Antolin A Authorized by:  Terri PiedraForcucci, Lorenz Donley A Consent: Verbal consent obtained. Risks and benefits: risks, benefits and alternatives were discussed Consent given by: patient Patient identity confirmed: provided demographic data Prepped and Draped in normal sterile fashion Wound explored  Laceration Location: Volar surface right wrist  Laceration Length: 5-6 cm  No Foreign Bodies seen or palpated  Anesthesia: local infiltration  Local anesthetic: lidocaine 2%% w epinephrine  Anesthetic total: 10 ml  Irrigation method: syringe Amount of cleaning: extensive with gauze debridement  Skin closure: 4-0 prolene sutures simple interrupted.  Appears to be some involvement of the flexor carpi ulnaris  Number of sutures: 7  Patient tolerance: Patient tolerated the procedure well with no immediate complications.  Labs Review Labs Reviewed - No data to display  Imaging Review Dg Wrist Complete Right  05/22/2014   CLINICAL DATA:  Anterior laceration to the right wrist after injury with glass fish tank.  EXAM: RIGHT WRIST - COMPLETE 3+ VIEW  COMPARISON:  None.  FINDINGS: There is no evidence of fracture or dislocation. There is no evidence of arthropathy or other focal bone abnormality. Soft tissues are unremarkable. No radiopaque soft tissue foreign bodies.  IMPRESSION: Negative.   Electronically Signed   By: Burman NievesWilliam  Stevens M.D.   On: 05/22/2014 23:41     EKG Interpretation None      MDM   Final diagnoses:  Wrist laceration, right, initial encounter  Tendon laceration   Patient is a 34 year old male who presents emergency room for evaluation of wrist laceration. Patient is up-to-date on his tetanus. X-ray reveals no evidence of foreign bodies. Laceration was repaired as seen above. There does appear to be some involvement of likely the flexor carpi ulnaris tendon. Wound has been closed temporarily for now. Will have patient follow-up with hand surgery. Patient to return for signs of infection which we have  discussed. Patient is stable for discharge at this time. Patient was discussed with Dr. Karma GanjaLinker who agrees with the above workup and plan. Extensive debridement and irrigation was performed here.  I personally performed the services described in this documentation, which was scribed in my presence. The recorded information has been reviewed and is accurate.      Eben Burowourtney A Forcucci, PA-C 05/23/14 0009  Ethelda ChickMartha K Linker, MD 05/23/14 0010

## 2014-05-22 NOTE — ED Notes (Addendum)
Pt fell through fishtank and injured right wrist, approximate 2 inch laceration, bleeding controlled-he states he had a tetanus shot last time he was here 3 years ago

## 2014-05-22 NOTE — ED Notes (Signed)
Radiology contacted about patients xray results, advised they are having some issues with results crossing over and that hopefully results will show up soon.

## 2014-05-22 NOTE — ED Notes (Addendum)
Patient becoming agitated, stating he "can sew his hand up himself" and stating that he is going to leave. PA made aware.

## 2014-05-22 NOTE — ED Notes (Addendum)
Pt states he was playing with his son, missed his son and slammed his hand into a glass fish tank. Admits to drinking some alcohol to help with his pain before he got here.

## 2014-05-22 NOTE — ED Notes (Addendum)
This RN witnessed pt cursing at radiology transporter upon his arrival stating "I don't need a GD xray, all I need is stitches, I'm going to get my coat and leave." radiology tech stood at door and waited for patient to get in wheelchair after stating he needed to take him for xray, patient verbalizing threats towards radiology transporter and saying he could "meet him out in the parking lot." transporter did not respond, simply waited at door for patient to enter wheelchair for transport.

## 2014-05-22 NOTE — ED Notes (Signed)
Pt states wound rebandaged in triage, wound bleeding through bandage, reenforcement added to current bandage. PA made aware that wound is still bleeding some.

## 2014-05-23 NOTE — Discharge Instructions (Signed)
Tendon Repair You have an injured tendon. This is a cord like structure that connects muscles to bones. Muscles and tendons work together to move your arms, legs, fingers, and toes. Your doctor may repair your tendon by sewing it back together. Following this repair the tendon needs to be immobilized (made to not move) and not used to allow it to heal. Sometimes the condition of the wound and the type of damage done may make it necessary to simply clean and repair the wound and then go back in and repair the tendon in about one week to ten days to obtain a better result. Sometimes x-rays are required to make sure there is no additional boney injury. With complete rupture (break) of a tendon, surgical repair with casting is necessary. Surgery allows the surgeon to put the tendon back together. The cast is used to allow the repair time to heal. The injury may be put in a caste or immobilized for 6 to 10 weeks. Immobilization means that the tendon injured is kept in position with a cast or splint. Once your caregiver feels you have healed well enough following this injury, they will provide exercises you can do to rehabilitate (make better) the injured tendon. HOME CARE INSTRUCTIONS   Do not use the injured tendon for as long as directed by your caregiver.  Rest the tendon as directed. Do not use it for lifting, walking, etc.  Leave the splint or dressing in place for as long as directed by your caregiver. Return for your first dressing change as directed.  Keep the dressing clean and dry.  Keep the injured area raised above the level of your heart as much as possible for the first week. SEEK MEDICAL CARE IF:   You have increased bleeding (more than a small spot) from the wound or from beneath your cast or splint.  You have redness, swelling, or increasing pain in the wound or from beneath your cast or splint.  You have pus coming from the wound or from beneath the cast or splint.  You notice a bad  smell coming from the wound or dressing or from beneath your cast or splint.  You develop increasing pain and swelling, not controlled with medicine. SEEK IMMEDIATE MEDICAL CARE IF:   You have a fever.  You develop a rash.  You have difficulty breathing.  You have any allergic problems. Document Released: 09/21/2000 Document Revised: 06/20/2011 Document Reviewed: 02/12/2007 [facility] Patient Information 2015 [place], [place]. This information is not intended to replace advice given to you by your health care provider. Make sure you discuss any questions you have with your health care provider.   Laceration Care, Adult A laceration is a cut or lesion that goes through all layers of the skin and into the tissue just beneath the skin. TREATMENT  Some lacerations may not require closure. Some lacerations may not be able to be closed due to an increased risk of infection. It is important to see your caregiver as soon as possible after an injury to minimize the risk of infection and maximize the opportunity for successful closure. If closure is appropriate, pain medicines may be given, if needed. The wound will be cleaned to help prevent infection. Your caregiver will use stitches (sutures), staples, wound glue (adhesive), or skin adhesive strips to repair the laceration. These tools bring the skin edges together to allow for faster healing and a better cosmetic outcome. However, all wounds will heal with a scar. Once the wound has  healed, scarring can be minimized by covering the wound with sunscreen during the day for 1 full year. HOME CARE INSTRUCTIONS  For sutures or staples:  Keep the wound clean and dry.  If you were given a bandage (dressing), you should change it at least once a day. Also, change the dressing if it becomes wet or dirty, or as directed by your caregiver.  Wash the wound with soap and water 2 times a day. Rinse the wound off with water to remove all soap. Pat the wound dry  with a clean towel.  After cleaning, apply a thin layer of the antibiotic ointment as recommended by your caregiver. This will help prevent infection and keep the dressing from sticking.  You may shower as usual after the first 24 hours. Do not soak the wound in water until the sutures are removed.  Only take over-the-counter or prescription medicines for pain, discomfort, or fever as directed by your caregiver.  Get your sutures or staples removed as directed by your caregiver. For skin adhesive strips:  Keep the wound clean and dry.  Do not get the skin adhesive strips wet. You may bathe carefully, using caution to keep the wound dry.  If the wound gets wet, pat it dry with a clean towel.  Skin adhesive strips will fall off on their own. You may trim the strips as the wound heals. Do not remove skin adhesive strips that are still stuck to the wound. They will fall off in time. For wound adhesive:  You may briefly wet your wound in the shower or bath. Do not soak or scrub the wound. Do not swim. Avoid periods of heavy perspiration until the skin adhesive has fallen off on its own. After showering or bathing, gently pat the wound dry with a clean towel.  Do not apply liquid medicine, cream medicine, or ointment medicine to your wound while the skin adhesive is in place. This may loosen the film before your wound is healed.  If a dressing is placed over the wound, be careful not to apply tape directly over the skin adhesive. This may cause the adhesive to be pulled off before the wound is healed.  Avoid prolonged exposure to sunlight or tanning lamps while the skin adhesive is in place. Exposure to ultraviolet light in the first year will darken the scar.  The skin adhesive will usually remain in place for 5 to 10 days, then naturally fall off the skin. Do not pick at the adhesive film. You may need a tetanus shot if:  You cannot remember when you had your last tetanus shot.  You have  never had a tetanus shot. If you get a tetanus shot, your arm may swell, get red, and feel warm to the touch. This is common and not a problem. If you need a tetanus shot and you choose not to have one, there is a rare chance of getting tetanus. Sickness from tetanus can be serious. SEEK MEDICAL CARE IF:   You have redness, swelling, or increasing pain in the wound.  You see a red line that goes away from the wound.  You have yellowish-white fluid (pus) coming from the wound.  You have a fever.  You notice a bad smell coming from the wound or dressing.  Your wound breaks open before or after sutures have been removed.  You notice something coming out of the wound such as wood or glass.  Your wound is on your hand or foot  and you cannot move a finger or toe. SEEK IMMEDIATE MEDICAL CARE IF:   Your pain is not controlled with prescribed medicine.  You have severe swelling around the wound causing pain and numbness or a change in color in your arm, hand, leg, or foot.  Your wound splits open and starts bleeding.  You have worsening numbness, weakness, or loss of function of any joint around or beyond the wound.  You develop painful lumps near the wound or on the skin anywhere on your body. MAKE SURE YOU:   Understand these instructions.  Will watch your condition.  Will get help right away if you are not doing well or get worse. Document Released: 03/28/2005 Document Revised: 06/20/2011 Document Reviewed: 09/21/2010 [facility] Patient Information 2015 [place], [place]. This information is not intended to replace advice given to you by your health care provider. Make sure you discuss any questions you have with your health care provider.

## 2014-11-28 ENCOUNTER — Encounter (HOSPITAL_COMMUNITY): Payer: Self-pay | Admitting: *Deleted

## 2014-11-28 ENCOUNTER — Emergency Department (HOSPITAL_COMMUNITY)
Admission: EM | Admit: 2014-11-28 | Discharge: 2014-11-28 | Disposition: A | Discharge status: Home / Self Care | Payer: Self-pay | Attending: Emergency Medicine | Admitting: Emergency Medicine

## 2014-11-28 DIAGNOSIS — J029 Acute pharyngitis, unspecified: Secondary | ICD-10-CM | POA: Insufficient documentation

## 2014-11-28 DIAGNOSIS — Z720 Tobacco use: Secondary | ICD-10-CM | POA: Insufficient documentation

## 2014-11-28 DIAGNOSIS — J028 Acute pharyngitis due to other specified organisms: Secondary | ICD-10-CM

## 2014-11-28 DIAGNOSIS — M542 Cervicalgia: Secondary | ICD-10-CM | POA: Insufficient documentation

## 2014-11-28 LAB — RAPID STREP SCREEN (MED CTR MEBANE ONLY): STREPTOCOCCUS, GROUP A SCREEN (DIRECT): POSITIVE — AB

## 2014-11-28 MED ORDER — DEXAMETHASONE SODIUM PHOSPHATE 10 MG/ML IJ SOLN
10.0000 mg | Freq: Once | INTRAMUSCULAR | Status: AC
Start: 2014-11-28 — End: 2014-11-28
  Administered 2014-11-28: 10 mg via INTRAMUSCULAR
  Filled 2014-11-28: qty 1

## 2014-11-28 MED ORDER — AMOXICILLIN 500 MG PO CAPS: 500.0000 mg | ORAL_CAPSULE | Freq: Two times a day (BID) | ORAL | Status: DC

## 2014-11-28 MED ORDER — TRAMADOL HCL 50 MG PO TABS: 50.0000 mg | ORAL_TABLET | Freq: Four times a day (QID) | ORAL | Status: DC | PRN

## 2014-11-28 MED ORDER — HYDROCODONE-ACETAMINOPHEN 5-325 MG PO TABS
2.0000 | ORAL_TABLET | Freq: Once | ORAL | Status: AC
  Administered 2014-11-28: 2 via ORAL
  Filled 2014-11-28: qty 2

## 2014-11-28 MED ORDER — LIDOCAINE HCL (PF) 1 % IJ SOLN
3.0000 mL | Freq: Once | INTRAMUSCULAR | Status: AC
  Administered 2014-11-28: 3 mL
  Filled 2014-11-28: qty 5

## 2014-11-28 MED ORDER — KETOROLAC TROMETHAMINE 60 MG/2ML IM SOLN
60.0000 mg | Freq: Once | INTRAMUSCULAR | Status: DC
  Filled 2014-11-28: qty 2

## 2014-11-28 NOTE — ED Notes (Signed)
2 days ago the pts throat began to "swell" and hurt. Pt came to the ED for relief.

## 2014-11-28 NOTE — ED Notes (Signed)
NAD at this time. Pt is stable and going home.  

## 2014-11-28 NOTE — Discharge Instructions (Signed)
If you were given medicines take as directed.  If you are on coumadin or contraceptives realize their levels and effectiveness is altered by many different medicines.  If you have any reaction (rash, tongues swelling, other) to the medicines stop taking and see a physician.    If your blood pressure was elevated in the ER make sure you follow up for management with a primary doctor or return for chest pain, shortness of breath or stroke symptoms.  Please follow up as directed and return to the ER or see a physician for new or worsening symptoms.  Thank you. Filed Vitals:   11/28/14 0943  BP: 122/72  Pulse: 79  Temp: 98.4 F (36.9 C)  Resp: 22  Height:  (1.753 m)  Weight: 180 lb (81.647 kg)  SpO2: 96%

## 2014-11-28 NOTE — ED Provider Notes (Signed)
CSN: [id number]     Arrival date & time 11/28/14  4098 History   First MD Initiated Contact with Patient 11/28/14 [phone]     Chief Complaint  Patient presents with  . Sore Throat     (Consider location/radiation/quality/duration/timing/severity/associated sxs/prior Treatment) HPI Comments: 34 year old male with history of reflux, possible gastric ulcer, tobacco abuse presents with worsening sore throat and swelling sensation. This is been going for 2 days. Patient gets this approximately yearly. Pain with swallowing however patient is tolerating liquid. No fevers or chills. Patient feels well otherwise. Patient has never had an abscess that he knows of however the specialist once used a needle to aspirate but did not obtain purulent fluid.  Patient is a 34 y.o. male presenting with pharyngitis. The history is provided by the patient.  Sore Throat Pertinent negatives include no chest pain, no headaches and no shortness of breath.    Past Medical History  Diagnosis Date  . No pertinent past medical history    Past Surgical History  Procedure Laterality Date  . No past surgeries     No family history on file. Social History  Substance Use Topics  . Smoking status: Heavy Tobacco Smoker -- 1.00 packs/day  . Smokeless tobacco: None  . Alcohol Use: No    Review of Systems  Constitutional: Negative for fever and chills.  HENT: Positive for sore throat.   Respiratory: Negative for shortness of breath.   Cardiovascular: Negative for chest pain.  Gastrointestinal: Negative for vomiting.  Musculoskeletal: Positive for neck pain. Negative for neck stiffness.  Skin: Negative for rash.  Neurological: Negative for headaches.      Allergies  Review of patient's allergies indicates no known allergies.  Home Medications   Prior to Admission medications   Medication Sig Start Date End Date Taking? Authorizing Provider  BUPRENORPHINE HCL SL Place 12 mg under the tongue daily.     Historical Provider, MD  clindamycin (CLEOCIN) 150 MG capsule Take 2 capsules (300 mg total) by mouth 3 (three) times daily. May dispense as  capsules 12/22/13   Harle Battiest, NP  oxyCODONE-acetaminophen (PERCOCET/ROXICET) 5-325 MG per tablet Take 1-2 tablets by mouth every 4 (four) hours as needed for moderate pain or severe pain. 12/22/13   Harle Battiest, NP  Phenyleph-Doxylamine-DM-APAP (ALKA SELTZER PLUS PO) Take 1 tablet by mouth every 6 (six) hours as needed (cold symptoms).    Historical Provider, MD   BP 122/72 mmHg  Pulse 79  Temp(Src) 98.4 F (36.9 C)  Resp 22  Ht  (1.753 m)  Wt 180 lb (81.647 kg)  BMI 26.57 kg/m2  SpO2 96% Physical Exam  Constitutional: He is oriented to person, place, and time. He appears well-developed and well-nourished.  HENT:  Head: Normocephalic and atraumatic.  Patient has mild posterior pharyngeal swelling without unilateral protrusion, exudate bilateral with enlarged tonsils, no hoarse voice, no trismus, mild anterior cervical adenopathy worse on the right neck supple  Eyes: Right eye exhibits no discharge. Left eye exhibits no discharge.  Neck: Normal range of motion. Neck supple. No tracheal deviation present.  Cardiovascular: Normal rate and regular rhythm.   Pulmonary/Chest: Effort normal.  Neurological: He is alert and oriented to person, place, and time.  Skin: Skin is warm. No rash noted.  Psychiatric: He has a normal mood and affect.  Nursing note and vitals reviewed.   ED Course  Procedures (including critical care time) EMERGENCY DEPARTMENT US SOFT TISSUE INTERPRETATION "Study: Limited Soft Tissue Ultrasound"  INDICATIONS: Soft  tissue infection Multiple views of the body part were obtained in real-time with a multi-frequency linear probe PERFORMED BY:  Myself IMAGES ARCHIVED?: Yes SIDE:Right  BODY PART:Other soft tisse (comment in note) pharynx FINDINGS: No abcess noted INTERPRETATION:  No abcess  noted   CPT: Neck 76536-26history isUpper extremity 16109-60  Axilla 45409-81  Chest wall 19147-82  Beast 95621-30  Upper back 86578-46  Lower back 96295-28  Abdominal wall 41324-40  Pelvic wall 10272-53  Lower extremity 66440-34  Other soft tissue 74259-56   Labs Review Labs Reviewed  RAPID STREP SCREEN (NOT AT [facility])    Imaging Review No results found. I have personally reviewed and evaluated these images and lab results as part of my medical decision-making.   EKG Interpretation None      MDM   Final diagnoses:  Acute pharyngitis due to other specified organisms   Concern for acute pharyngitis. With recurrent history and worse on the right plan for bedside ultrasound look for sign of early abscess. Plan for antibody treatment and follow-up culture result outpatient. No concern for epiglottitis at this time.  No abscess visualized with bedside ultrasound. Patient will follow-up with oral antibiotics and discussed following culture results to see if strep. Results and differential diagnosis were discussed with the patient/parent/guardian. Xrays were independently reviewed by myself.  Close follow up outpatient was discussed, comfortable with the plan.   Medications  dexamethasone (DECADRON) injection 10 mg (not administered)  lidocaine (PF) (XYLOCAINE) 1 % injection 3 mL (not administered)  HYDROcodone-acetaminophen (NORCO/VICODIN) 5-325 MG per tablet 2 tablet (not administered)    Filed Vitals:   11/28/14 0943  BP: 122/72  Pulse: 79  Temp: 98.4 F (36.9 C)  Resp: 22  Height: 5\' 9"  (1.753 m)  Weight: 180 lb (81.647 kg)  SpO2: 96%    Final diagnoses:  Acute pharyngitis due to other specified organisms        Blane Ohara, MD 11/28/14 [phone]

## 2016-01-11 ENCOUNTER — Emergency Department (HOSPITAL_BASED_OUTPATIENT_CLINIC_OR_DEPARTMENT_OTHER): Payer: Self-pay

## 2016-01-11 ENCOUNTER — Emergency Department (HOSPITAL_BASED_OUTPATIENT_CLINIC_OR_DEPARTMENT_OTHER)
Admission: EM | Admit: 2016-01-11 | Discharge: 2016-01-11 | Disposition: A | Discharge status: Home / Self Care | Payer: Self-pay | Attending: Emergency Medicine | Admitting: Emergency Medicine

## 2016-01-11 ENCOUNTER — Encounter (HOSPITAL_BASED_OUTPATIENT_CLINIC_OR_DEPARTMENT_OTHER): Payer: Self-pay | Admitting: Emergency Medicine

## 2016-01-11 DIAGNOSIS — F17200 Nicotine dependence, unspecified, uncomplicated: Secondary | ICD-10-CM | POA: Insufficient documentation

## 2016-01-11 DIAGNOSIS — Z79899 Other long term (current) drug therapy: Secondary | ICD-10-CM | POA: Insufficient documentation

## 2016-01-11 DIAGNOSIS — M62838 Other muscle spasm: Secondary | ICD-10-CM | POA: Insufficient documentation

## 2016-01-11 DIAGNOSIS — M5412 Radiculopathy, cervical region: Secondary | ICD-10-CM | POA: Insufficient documentation

## 2016-01-11 MED ORDER — PREDNISONE 20 MG PO TABS: 40.0000 mg | ORAL_TABLET | Freq: Every day | ORAL | 0 refills | Status: DC

## 2016-01-11 MED ORDER — NAPROXEN 500 MG PO TABS: 500.0000 mg | ORAL_TABLET | Freq: Two times a day (BID) | ORAL | 0 refills | Status: DC

## 2016-01-11 MED ORDER — CYCLOBENZAPRINE HCL 10 MG PO TABS: 10.0000 mg | ORAL_TABLET | Freq: Two times a day (BID) | ORAL | 0 refills | Status: DC | PRN

## 2016-01-11 MED FILL — predniSONE 20 MG TABS: 20 | 5 days supply | Qty: 10 | Fill #0

## 2016-01-11 MED FILL — NAPROXEN 500 MG TABLET: 500 | 10 days supply | Qty: 20 | Fill #0

## 2016-01-11 MED FILL — CYCLOBENZAPRINE 10 MG TAB: 10 | 10 days supply | Qty: 20 | Fill #0

## 2016-01-11 NOTE — ED Triage Notes (Signed)
Patient reports left arm numbness x 1 week.  Reports he also injured his back 1 week ago after falling off ladder.  Reports pain to lumbar area.

## 2016-01-11 NOTE — ED Provider Notes (Addendum)
[facility] Provider [facility]   CSN: [id number] Arrival date & time: 01/11/16  1036     History   Chief Complaint Chief Complaint  Patient presents with  . Arm Pain    HPI Walter Miller is a 35 y.o. male.  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 He missed the last 2-3 steps twisting and landing face down on the ground. Since that time he has had persistent left arm numbness most significantly in the first 3 fingers. Also has developed more left neck and shoulder pain as well as left-sided lower back pain. He denies any pain in his lower extremities or numbness. No chest pain or shortness of breath. He denies hitting his head or LOC during the event. He is taking Aleve several times but his symptoms have not resolved. [employer] and he was concerned about going back to work.   The history is provided by the patient.  Arm Pain  This is a new problem. Episode onset: 1 week. The problem occurs constantly. The problem has not changed since onset.Pertinent negatives include no chest pain and no shortness of breath. Associated symptoms comments: Lower back pain but no radiation of pain into the legs.  Numbness of the left arm and difficulty gripping objects.. The symptoms are aggravated by twisting. Nothing relieves the symptoms. Treatments tried: aleve. The treatment provided no relief.    Past Medical History:  Diagnosis Date  . No pertinent past medical history     Patient Active Problem List   Diagnosis Date Noted  . GERD (gastroesophageal reflux disease) 04/20/2012  . Gastric ulcer 04/20/2012    Past Surgical History:  Procedure Laterality Date  . NO PAST SURGERIES         Home Medications    Prior to Admission medications   Medication Sig Start Date End Date Taking? Authorizing Provider  amoxicillin  (AMOXIL) 500 MG capsule Take 1 capsule (500 mg total) by mouth 2 (two) times daily. 11/28/14   Blane OharaJoshua Zavitz, MD  traMADol (ULTRAM) 50 MG tablet Take 1 tablet (50 mg total) by mouth every 6 (six) hours as needed. 11/28/14   Blane OharaJoshua Zavitz, MD    Family History History reviewed. No pertinent family history.  Social History Social History  Substance Use Topics  . Smoking status: Heavy Tobacco Smoker    Packs/day: 1.00  . Smokeless tobacco: Never Used  . Alcohol use No     Allergies   Review of patient's allergies indicates no known allergies.   Review of Systems Review of Systems  Respiratory: Negative for shortness of breath.   Cardiovascular: Negative for chest pain.  All other systems reviewed and are negative.    Physical Exam Updated Vital Signs BP 130/79 (BP Location: Right Arm)   Pulse 92   Temp 97.9 F (36.6 C) (Oral)   Resp 18   Ht 5\' 8"  (1.727 m)   Wt 187 lb (84.8 kg)   SpO2 94%   BMI 28.43 kg/m   Physical Exam  Constitutional: He is oriented to person, place, and time. He appears well-developed and well-nourished. No distress.  HENT:  Head: Normocephalic and atraumatic.  Mouth/Throat: Oropharynx is clear and moist.  Eyes: Conjunctivae and EOM are normal. Pupils are equal, round, and reactive to light.  Neck: Normal range of motion.  Neck supple. Muscular tenderness present. No spinous process tenderness present. Normal range of motion present.    Cardiovascular: Normal rate, regular rhythm and intact distal pulses.   No murmur heard. Pulmonary/Chest: Effort normal and breath sounds normal. No respiratory distress. He has no wheezes. He has no rales.  Abdominal: Soft. He exhibits no distension. There is no tenderness. There is no rebound and no guarding.  Musculoskeletal: Normal range of motion. He exhibits no edema.       Lumbar back: He exhibits tenderness, pain and spasm.       Back:  Neurological: He is alert and oriented to person, place, and time.  A sensory deficit is present. No cranial nerve deficit.  Mild decreased strength in left hand grip but 5/5 strength in bilateral bicep/tricep and forearm.  5/5 lower ext strength.  Decreased sensation in the left thumb and 2/3rd fingers.  Normal sensation over the lateral arm  Skin: Skin is warm and dry. No rash noted. No erythema.  Psychiatric: He has a normal mood and affect. His behavior is normal.  Nursing note and vitals reviewed.    ED Treatments / Results  Labs (all labs ordered are listed, but only abnormal results are displayed) Labs Reviewed - No data to display  EKG  EKG Interpretation None       Radiology Ct Cervical Spine Wo Contrast  Result Date: 01/11/2016 CLINICAL DATA:  Patient states that he fell 1 week ago and has been having left sided neck pains with numbness and tingling down left arm, no other complaints EXAM: CT CERVICAL SPINE WITHOUT CONTRAST TECHNIQUE: Multidetector CT imaging of the cervical spine was performed without intravenous contrast. Multiplanar CT image reconstructions were also generated. COMPARISON:  Cervical spine radiographs, 01/18/2013 FINDINGS: Alignment: Mild reversal of the normal cervical lordosis. No spondylolisthesis. Skull base and vertebrae: No acute fracture. No primary bone lesion or focal pathologic process. Soft tissues and spinal canal: Spinal canal is widely patent. Soft tissues are unremarkable. Disc levels: No significant loss of disc height. Minor uncovertebral spurring on the left at C3-C4. No other endplate spurring. No significant disc bulging. No disc herniations. Neural foramina are widely patent. Upper chest: Negative. Other: None IMPRESSION: 1. No fracture or bone lesion. 2. Mild reversal of the normal cervical lordosis. No spondylolisthesis. 3. Minor endplate spurring at C3-C4.  No other degenerative change. 4. No disc herniation or significant disc bulging. No stenosis. No evidence of nerve root impingement. Electronically  Signed   By: Amie Portland M.D.   On: 01/11/2016 11:20    Procedures Procedures (including critical care time)  Medications Ordered in ED Medications - No data to display   Initial Impression / Assessment and Plan / ED Course  I have reviewed the triage vital signs and the nursing notes.  Pertinent labs & imaging results that were available during my care of the patient were reviewed by me and considered in my medical decision making (see chart for details).  Clinical Course    Patient is a 35 year old male presenting today with left arm numbness, neck pain and back pain after a fall from a ladder one week ago. Patient states that numbness is waxing and waning in severity but has been persistent always involving the left thumb but will include the fingers as well. Also significant pain in the left shoulder blade and neck. Also has developed some left-sided back pain as well. He denies any radicular symptoms in the lower extremities. On exam he has mild weakness  in hand grip strength on the left as well as decreased sensation to the first 3 digits. No elbow pain or shoulder pain with palpation or range of motion. Significant muscle tenderness and spasm in the trapezial distribution on exam. C-spine is negative for fracture. Lumbar spine has some mild left-sided tenderness which is musculoskeletal in nature. Will treat with steroids, anti-inflammatories and Flexeril.  Final Clinical Impressions(s) / ED Diagnoses   Final diagnoses:  Cervical radiculopathy  Muscle spasm    New Prescriptions New Prescriptions   CYCLOBENZAPRINE (FLEXERIL) 10 MG TABLET    Take 1 tablet (10 mg total) by mouth 2 (two) times daily as needed for muscle spasms.   NAPROXEN (NAPROSYN) 500 MG TABLET    Take 1 tablet (500 mg total) by mouth 2 (two) times daily.   PREDNISONE (DELTASONE) 20 MG TABLET    Take 2 tablets (40 mg total) by mouth daily.     Gwyneth Sprout, MD 01/11/16 1154    Gwyneth Sprout,  MD 01/11/16 1156

## 2016-02-15 ENCOUNTER — Emergency Department (HOSPITAL_COMMUNITY): Payer: Self-pay

## 2016-02-15 ENCOUNTER — Emergency Department (HOSPITAL_COMMUNITY)
Admission: EM | Admit: 2016-02-15 | Discharge: 2016-02-15 | Disposition: A | Discharge status: Home / Self Care | Payer: Self-pay | Attending: Emergency Medicine | Admitting: Emergency Medicine

## 2016-02-15 DIAGNOSIS — Y929 Unspecified place or not applicable: Secondary | ICD-10-CM | POA: Insufficient documentation

## 2016-02-15 DIAGNOSIS — W11XXXA Fall on and from ladder, initial encounter: Secondary | ICD-10-CM | POA: Insufficient documentation

## 2016-02-15 DIAGNOSIS — F17200 Nicotine dependence, unspecified, uncomplicated: Secondary | ICD-10-CM | POA: Insufficient documentation

## 2016-02-15 DIAGNOSIS — Y939 Activity, unspecified: Secondary | ICD-10-CM | POA: Insufficient documentation

## 2016-02-15 DIAGNOSIS — M546 Pain in thoracic spine: Secondary | ICD-10-CM | POA: Insufficient documentation

## 2016-02-15 DIAGNOSIS — Y990 Civilian activity done for income or pay: Secondary | ICD-10-CM | POA: Insufficient documentation

## 2016-02-15 DIAGNOSIS — M5442 Lumbago with sciatica, left side: Secondary | ICD-10-CM | POA: Insufficient documentation

## 2016-02-15 MED ORDER — OXYCODONE-ACETAMINOPHEN 5-325 MG PO TABS: 1.0000 | ORAL_TABLET | Freq: Four times a day (QID) | ORAL | 0 refills | Status: DC | PRN

## 2016-02-15 MED ORDER — OXYCODONE-ACETAMINOPHEN 5-325 MG PO TABS
1.0000 | ORAL_TABLET | Freq: Once | ORAL | Status: AC
  Administered 2016-02-15: 1 via ORAL
  Filled 2016-02-15: qty 1

## 2016-02-15 MED ORDER — PREDNISONE 10 MG (21) PO TBPK: 10.0000 mg | ORAL_TABLET | Freq: Every day | ORAL | 0 refills | Status: DC

## 2016-02-15 MED ORDER — METHOCARBAMOL 500 MG PO TABS: 500.0000 mg | ORAL_TABLET | Freq: Two times a day (BID) | ORAL | 0 refills | Status: DC

## 2016-02-15 MED ORDER — LIDOCAINE 5 % EX PTCH: 1.0000 | MEDICATED_PATCH | CUTANEOUS | 0 refills | Status: DC

## 2016-02-15 MED ORDER — NAPROXEN 500 MG PO TABS: 500.0000 mg | ORAL_TABLET | Freq: Two times a day (BID) | ORAL | 0 refills | Status: DC

## 2016-02-15 MED ORDER — KETOROLAC TROMETHAMINE 60 MG/2ML IM SOLN
60.0000 mg | Freq: Once | INTRAMUSCULAR | Status: AC
Start: 2016-02-15 — End: 2016-02-15
  Administered 2016-02-15: 60 mg via INTRAMUSCULAR
  Filled 2016-02-15: qty 2

## 2016-02-15 NOTE — ED Triage Notes (Signed)
Pt reports back pain after a fall at work. Pt had a repeat fall and now has lower back pain and neck pain. Pt can move the neck but reports it is painful.

## 2016-02-15 NOTE — Discharge Instructions (Signed)
Take it easy, but do not lay around too much as this may make the stiffness worse. Take 500 mg of naproxen every 12 hours or 800 mg of ibuprofen every 8 hours for the next 3 days. Take these medications with food to avoid upset stomach. Robaxin is a muscle relaxer and may help loosen stiff muscles. Percocet for severe pain. Do not take the Robaxin or Percocet while driving or performing other dangerous activities. Follow up with a primary care provider for future management of this issue.

## 2016-02-15 NOTE — ED Notes (Signed)
Patient returned from xray.

## 2016-02-15 NOTE — ED Provider Notes (Signed)
[facility]-EMERGENCY DEPT Provider Note   By signing my name below, I, Earmon Phoenix, attest that this documentation has been prepared under the direction and in the presence of Camar Guyton, PA-C. Electronically Signed: Earmon Phoenix, ED Scribe. 02/15/16. 1:11 PM.    History   Chief Complaint Chief Complaint  Patient presents with  . Back Pain  . Neck Pain     The history is provided by the patient and medical records. No language interpreter was used.    HPI Comments:  Walter Miller is a 35 y.o. male who presents to the Emergency Department complaining of a fall from a ladder that was approximately 4-5 feet in height that occurred yesterday. Pt states he caught himself with his arms causing a jarring feeling in his right shoulder and midback. He states he fell from about 12 feet two weeks ago and was seen at [facility]. He reports neck pain, right shoulder pain and low back pain. Back pain is moderate and radiates down the back of the left leg. He has not taken anything for pain. Movements increase the pain. He denies alleviating factors. He denies head trauma, LOC, numbness, tingling or weakness of any extremity, changes in bowel or bladder function, wounds, or any other complaints or injuries. Pt is ambulatory without assistance. He denies allergies to any medications.     Past Medical History:  Diagnosis Date  . No pertinent past medical history     Patient Active Problem List   Diagnosis Date Noted  . GERD (gastroesophageal reflux disease) 04/20/2012  . Gastric ulcer 04/20/2012    Past Surgical History:  Procedure Laterality Date  . NO PAST SURGERIES       Home Medications    Prior to Admission medications   Medication Sig Start Date End Date Taking? Authorizing Provider  amoxicillin (AMOXIL) 500 MG capsule Take 1 capsule (500 mg total) by mouth 2 (two) times daily. 11/28/14   Blane Ohara, MD  cyclobenzaprine (FLEXERIL) 10 MG tablet Take 1 tablet (10 mg  total) by mouth 2 (two) times daily as needed for muscle spasms. 01/11/16   Gwyneth Sprout, MD  lidocaine (LIDODERM) 5 % Place 1 patch onto the skin daily. Remove & Discard patch within 12 hours or as directed by MD 02/15/16   Anselm Pancoast, PA-C  methocarbamol (ROBAXIN) 500 MG tablet Take 1 tablet (500 mg total) by mouth 2 (two) times daily. 02/15/16   Weaver Tweed C Dakiyah Heinke, PA-C  naproxen (NAPROSYN) 500 MG tablet Take 1 tablet (500 mg total) by mouth 2 (two) times daily. 01/11/16   Gwyneth Sprout, MD  naproxen (NAPROSYN) 500 MG tablet Take 1 tablet (500 mg total) by mouth 2 (two) times daily. 02/15/16   Malonie Tatum C Naarah Borgerding, PA-C  oxyCODONE-acetaminophen (PERCOCET/ROXICET) 5-325 MG tablet Take 1-2 tablets by mouth every 6 (six) hours as needed for severe pain. 02/15/16   Cruz Bong C Neeka Urista, PA-C  predniSONE (STERAPRED UNI-PAK 21 TAB) 10 MG (21) TBPK tablet Take 1 tablet (10 mg total) by mouth daily. Take 6 tabs day 1, 5 tabs day 2, 4 tabs day 3, 3 tabs day 4, 2 tabs day 5, and 1 tab on day 6. 02/15/16   Arrion Broaddus C Keylor Rands, PA-C  traMADol (ULTRAM) 50 MG tablet Take 1 tablet (50 mg total) by mouth every 6 (six) hours as needed. 11/28/14   Blane Ohara, MD    Family History No family history on file.  Social History Social History  Substance Use Topics  .  Smoking status: Heavy Tobacco Smoker    Packs/day: 1.00  . Smokeless tobacco: Never Used  . Alcohol use No     Allergies   Patient has no known allergies.   Review of Systems Review of Systems  Genitourinary:       No bowel or bladder incontinence  Musculoskeletal: Positive for arthralgias, back pain and neck pain.  Skin: Negative for color change and wound.  Neurological: Negative for syncope, weakness and numbness.  All other systems reviewed and are negative.    Physical Exam Updated Vital Signs BP 116/77 (BP Location: Right Arm)   Pulse 73   Temp 98.2 F (36.8 C) (Oral)   Resp 17   Ht 5\' 8"  (1.727 m)   Wt 185 lb (83.9 kg)   SpO2 99%   BMI 28.13 kg/m     Physical Exam  Constitutional: He appears well-developed and well-nourished. No distress.  HENT:  Head: Normocephalic and atraumatic.  Eyes: Conjunctivae and EOM are normal. Pupils are equal, round, and reactive to light.  Neck: Normal range of motion. Neck supple.  Cardiovascular: Normal rate and intact distal pulses.   Pulmonary/Chest: Effort normal. No respiratory distress.  Abdominal: Soft. There is no tenderness. There is no guarding.  Musculoskeletal: He exhibits tenderness. He exhibits no edema or deformity.  Midline spinal tenderness to thoracic spine around T6-T8 and tenderness to flanking musculature. No step-off or deformity noted. Tenderness to cervical musculature and right trapezius. Full ROM in all extremities and spine. No other midline spinal tenderness.   Neurological: He is alert.  No sensory deficits. Strength 5/5 in all extremities. No gait disturbance. Coordination intact. Cranial nerves III-XII grossly intact.  Skin: Skin is warm and dry. He is not diaphoretic.  Psychiatric: He has a normal mood and affect. His behavior is normal.  Nursing note and vitals reviewed.    ED Treatments / Results  DIAGNOSTIC STUDIES: Oxygen Saturation is 99% on RA, normal by my interpretation.   COORDINATION OF CARE: 1:08 PM- Will X-Ray lumbar spine. Pt verbalizes understanding and agrees to plan.  Medications  oxyCODONE-acetaminophen (PERCOCET/ROXICET) 5-325 MG per tablet 1 tablet (1 tablet Oral Given 02/15/16 1319)  ketorolac (TORADOL) injection 60 mg (60 mg Intramuscular Given 02/15/16 1319)    Labs (all labs ordered are listed, but only abnormal results are displayed) Labs Reviewed - No data to display  EKG  EKG Interpretation None       Radiology Dg Thoracic Spine 2 View  Result Date: 02/15/2016 CLINICAL DATA:  Thoracic spine pain following a fall. EXAM: THORACIC SPINE 2 VIEWS COMPARISON:  Chest radiographs dated 04/20/2012. FINDINGS: Minimal scoliosis. Mild  lower thoracic and upper lumbar spine degenerative changes. Mild wedge deformity of the T11 vertebral body without significant change. No acute fracture or subluxation. IMPRESSION: No acute fracture or subluxation.  Mild degenerative changes. Electronically Signed   By: Beckie SaltsSteven  Reid M.D.   On: 02/15/2016 14:10   Dg Lumbar Spine Complete  Result Date: 02/15/2016 CLINICAL DATA:  Fall. EXAM: LUMBAR SPINE - COMPLETE 4+ VIEW COMPARISON:  And 2014. FINDINGS: Lumbar vertebra number with the lowest segmented appearing lumbar shaped vertebra on lateral view as L5 . Mild compressions T10, T11, T12, L1, L2 are noted. These are unchanged prior exam. No acute abnormality identified. Diffuse degenerative change present. Mild scoliosis concave right. IMPRESSION: Mild breast T10, T11, T12, L1, and L2 with noted. These are unchanged prior exam. No acute abnormality identified. Electronically Signed   By: Maisie Fushomas  Register  On: 02/15/2016 14:10    Procedures Procedures (including critical care time)  Medications Ordered in ED Medications  oxyCODONE-acetaminophen (PERCOCET/ROXICET) 5-325 MG per tablet 1 tablet (1 tablet Oral Given 02/15/16 1319)  ketorolac (TORADOL) injection 60 mg (60 mg Intramuscular Given 02/15/16 1319)     Initial Impression / Assessment and Plan / ED Course  I have reviewed the triage vital signs and the nursing notes.  Pertinent labs & imaging results that were available during my care of the patient were reviewed by me and considered in my medical decision making (see chart for details).  Clinical Course     Patient presents with back, shoulder, and neck pain. No acute abnormalities on x-ray. No neuro or functional deficits. PCP follow-up. Resources provided. The patient was given instructions for home care as well as return precautions. Patient voices understanding of these instructions, accepts the plan, and is comfortable with discharge.  Vitals:   02/15/16 1216 02/15/16 1401  BP:  116/77 118/74  Pulse: 73 68  Resp: 17 18  Temp: 98.2 F (36.8 C)   TempSrc: Oral   SpO2: 99% 100%  Weight: 83.9 kg   Height: 5\' 8"  (1.727 m)      I personally performed the services described in this documentation, which was scribed in my presence. The recorded information has been reviewed and is accurate.   Final Clinical Impressions(s) / ED Diagnoses   Final diagnoses:  Acute bilateral low back pain with left-sided sciatica    New Prescriptions Discharge Medication List as of 02/15/2016  2:28 PM    START taking these medications   Details  lidocaine (LIDODERM) 5 % Place 1 patch onto the skin daily. Remove & Discard patch within 12 hours or as directed by MD, Starting Mon 02/15/2016, Print    methocarbamol (ROBAXIN) 500 MG tablet Take 1 tablet (500 mg total) by mouth 2 (two) times daily., Starting Mon 02/15/2016, Print    !! naproxen (NAPROSYN) 500 MG tablet Take 1 tablet (500 mg total) by mouth 2 (two) times daily., Starting Mon 02/15/2016, Print    oxyCODONE-acetaminophen (PERCOCET/ROXICET) 5-325 MG tablet Take 1-2 tablets by mouth every 6 (six) hours as needed for severe pain., Starting Mon 02/15/2016, Print    predniSONE (STERAPRED UNI-PAK 21 TAB) 10 MG (21) TBPK tablet Take 1 tablet (10 mg total) by mouth daily. Take 6 tabs day 1, 5 tabs day 2, 4 tabs day 3, 3 tabs day 4, 2 tabs day 5, and 1 tab on day 6., Starting Mon 02/15/2016, Print     !! - Potential duplicate medications found. Please discuss with provider.       Anselm PancoastShawn C Deasiah Hagberg, PA-C 02/15/16 1515    Rolland PorterMark James, MD 03/08/16 0010

## 2016-02-15 NOTE — ED Notes (Signed)
Declined W/C at D/C and was escorted to lobby by RN. 

## 2016-03-09 ENCOUNTER — Encounter (HOSPITAL_COMMUNITY): Payer: Self-pay | Admitting: *Deleted

## 2016-03-09 DIAGNOSIS — K219 Gastro-esophageal reflux disease without esophagitis: Secondary | ICD-10-CM | POA: Insufficient documentation

## 2016-03-09 DIAGNOSIS — F17200 Nicotine dependence, unspecified, uncomplicated: Secondary | ICD-10-CM | POA: Insufficient documentation

## 2016-03-09 LAB — CBC
HCT: 44 % (ref 39.0–52.0)
Hemoglobin: 14.7 g/dL (ref 13.0–17.0)
MCH: 32.2 pg (ref 26.0–34.0)
MCHC: 33.4 g/dL (ref 30.0–36.0)
MCV: 96.5 fL (ref 78.0–100.0)
PLATELETS: 242 10*3/uL (ref 150–400)
RBC: 4.56 MIL/uL (ref 4.22–5.81)
RDW: 13.4 % (ref 11.5–15.5)
WBC: 13.6 10*3/uL — AB (ref 4.0–10.5)

## 2016-03-09 LAB — COMPREHENSIVE METABOLIC PANEL
ALT: 23 U/L (ref 17–63)
AST: 26 U/L (ref 15–41)
Albumin: 3.4 g/dL — ABNORMAL LOW (ref 3.5–5.0)
Alkaline Phosphatase: 59 U/L (ref 38–126)
Anion gap: 7 (ref 5–15)
BUN: 17 mg/dL (ref 6–20)
CHLORIDE: 105 mmol/L (ref 101–111)
CO2: 27 mmol/L (ref 22–32)
CREATININE: 1.01 mg/dL (ref 0.61–1.24)
Calcium: 9 mg/dL (ref 8.9–10.3)
GFR calc Af Amer: >60 mL/min (ref >60)
GFR calc non Af Amer: >60 mL/min (ref >60)
GLUCOSE: 104 mg/dL — AB (ref 65–99)
Potassium: 3.9 mmol/L (ref 3.5–5.1)
Sodium: 139 mmol/L (ref 135–145)
Total Bilirubin: 0.4 mg/dL (ref 0.3–1.2)
Total Protein: 6.3 g/dL — ABNORMAL LOW (ref 6.5–8.1)

## 2016-03-09 LAB — LIPASE, BLOOD: LIPASE: 21 U/L (ref 11–51)

## 2016-03-09 NOTE — ED Triage Notes (Signed)
Pt c/o epigastric pain x 2 days with NV. Hx of the same and told is was acid reflux. Pt also c/o dizziness

## 2016-03-10 ENCOUNTER — Emergency Department (HOSPITAL_COMMUNITY)
Admission: EM | Admit: 2016-03-10 | Discharge: 2016-03-10 | Disposition: A | Discharge status: Home / Self Care | Payer: Self-pay | Attending: Emergency Medicine | Admitting: Emergency Medicine

## 2016-03-10 DIAGNOSIS — K219 Gastro-esophageal reflux disease without esophagitis: Secondary | ICD-10-CM

## 2016-03-10 HISTORY — DX: Gastro-esophageal reflux disease without esophagitis: K21.9

## 2016-03-10 LAB — URINALYSIS, ROUTINE W REFLEX MICROSCOPIC
Bilirubin Urine: NEGATIVE
Glucose, UA: NEGATIVE mg/dL
HGB URINE DIPSTICK: NEGATIVE
Ketones, ur: NEGATIVE mg/dL
LEUKOCYTES UA: NEGATIVE
Nitrite: NEGATIVE
PROTEIN: NEGATIVE mg/dL
Specific Gravity, Urine: 1.031 — ABNORMAL HIGH (ref 1.005–1.030)
pH: 6 (ref 5.0–8.0)

## 2016-03-10 MED ORDER — PANTOPRAZOLE SODIUM 40 MG PO TBEC
80.0000 mg | DELAYED_RELEASE_TABLET | Freq: Once | ORAL | Status: AC
  Administered 2016-03-10: 80 mg via ORAL
  Filled 2016-03-10: qty 2

## 2016-03-10 MED ORDER — SUCRALFATE 1 GM/10ML PO SUSP: 1.0000 g | Freq: Three times a day (TID) | ORAL | 0 refills | Status: DC

## 2016-03-10 MED ORDER — GI COCKTAIL ~~LOC~~
30.0000 mL | Freq: Once | ORAL | Status: AC
  Administered 2016-03-10: 30 mL via ORAL
  Filled 2016-03-10: qty 30

## 2016-03-10 MED ORDER — OMEPRAZOLE 40 MG PO CPDR: 40.0000 mg | DELAYED_RELEASE_CAPSULE | Freq: Every day | ORAL | 1 refills | Status: DC

## 2016-03-10 MED ORDER — PROMETHAZINE HCL 25 MG PO TABS: 25.0000 mg | ORAL_TABLET | Freq: Four times a day (QID) | ORAL | 0 refills | Status: DC | PRN

## 2016-03-10 MED ORDER — ONDANSETRON 4 MG PO TBDP
8.0000 mg | ORAL_TABLET | Freq: Once | ORAL | Status: AC
  Administered 2016-03-10: 8 mg via ORAL
  Filled 2016-03-10: qty 2

## 2016-03-10 NOTE — ED Provider Notes (Signed)
TIME SEEN:  By signing my name below, I, Arianna Nassar, attest that this documentation has been prepared under the direction and in the presence of Enbridge EnergyKristen N Mariateresa Batra, DO.  Electronically Signed: Octavia HeirArianna Nassar, ED Scribe. 03/10/16. 1:52 AM.  CHIEF COMPLAINT:  Chief Complaint  Patient presents with  . Abdominal Pain     HPI:  HPI Comments: Walter Miller is a 35 y.o. male who presents to the Emergency Department complaining of moderate, gradual worsening epigastric abdominal pain x 2 days. He notes associated nausea, vomiting, dizziness and having a bitter sour taste in his mouth. Pt has hx of similar pain in the past and was told he may possibly have GERD or an ulcer. He has taken xantac, alka-seltzer plus, and Tums to relieve his pain minimally but has not taken any pain medication. States when he has had previous symptoms omeprazole has helped him. He denies diarrhea, melena, blood in stool, hx of abdominal surgeries, hx of gallbladder problems, or abuse of NSAIDS. No recent alcohol use. No history of problems with his gallbladder. No history of abdominal surgery.   ROS: See HPI Constitutional: no fever  Eyes: no drainage  ENT: no runny nose   Cardiovascular:  no chest pain  Resp: no SOB  GI: no vomiting GU: no dysuria Integumentary: no rash  Allergy: no hives  Musculoskeletal: no leg swelling  Neurological: no slurred speech ROS otherwise negative  PAST MEDICAL HISTORY/PAST SURGICAL HISTORY:  Past Medical History:  Diagnosis Date  . GERD (gastroesophageal reflux disease)   . No pertinent past medical history     MEDICATIONS:  Prior to Admission medications   Medication Sig Start Date End Date Taking? Authorizing Provider  amoxicillin (AMOXIL) 500 MG capsule Take 1 capsule (500 mg total) by mouth 2 (two) times daily. 11/28/14   Blane OharaJoshua Zavitz, MD  cyclobenzaprine (FLEXERIL) 10 MG tablet Take 1 tablet (10 mg total) by mouth 2 (two) times daily as needed for muscle spasms.  01/11/16   Gwyneth SproutWhitney Plunkett, MD  lidocaine (LIDODERM) 5 % Place 1 patch onto the skin daily. Remove & Discard patch within 12 hours or as directed by MD 02/15/16   Anselm PancoastShawn C Joy, PA-C  methocarbamol (ROBAXIN) 500 MG tablet Take 1 tablet (500 mg total) by mouth 2 (two) times daily. 02/15/16   Shawn C Joy, PA-C  naproxen (NAPROSYN) 500 MG tablet Take 1 tablet (500 mg total) by mouth 2 (two) times daily. 01/11/16   Gwyneth SproutWhitney Plunkett, MD  naproxen (NAPROSYN) 500 MG tablet Take 1 tablet (500 mg total) by mouth 2 (two) times daily. 02/15/16   Shawn C Joy, PA-C  oxyCODONE-acetaminophen (PERCOCET/ROXICET) 5-325 MG tablet Take 1-2 tablets by mouth every 6 (six) hours as needed for severe pain. 02/15/16   Shawn C Joy, PA-C  predniSONE (STERAPRED UNI-PAK 21 TAB) 10 MG (21) TBPK tablet Take 1 tablet (10 mg total) by mouth daily. Take 6 tabs day 1, 5 tabs day 2, 4 tabs day 3, 3 tabs day 4, 2 tabs day 5, and 1 tab on day 6. 02/15/16   Shawn C Joy, PA-C  traMADol (ULTRAM) 50 MG tablet Take 1 tablet (50 mg total) by mouth every 6 (six) hours as needed. 11/28/14   Blane OharaJoshua Zavitz, MD    ALLERGIES:  No Known Allergies  SOCIAL HISTORY:  Social History  Substance Use Topics  . Smoking status: Heavy Tobacco Smoker    Packs/day: 1.00  . Smokeless tobacco: Never Used  . Alcohol use No  FAMILY HISTORY: No family history on file.  EXAM: BP 119/63 (BP Location: Right Arm)   Pulse 62   Temp 98 F (36.7 C) (Oral)   Resp 18   Ht 5\' 8"  (1.727 m)   Wt 190 lb (86.2 kg)   SpO2 99%   BMI 28.89 kg/m  CONSTITUTIONAL: Alert and oriented and responds appropriately to questions. Well-appearing; well-nourished HEAD: Normocephalic EYES: Conjunctivae clear, PERRL, EOMI ENT: normal nose; no rhinorrhea; moist mucous membranes NECK: Supple, no meningismus, no nuchal rigidity, no LAD  CARD: RRR; S1 and S2 appreciated; no murmurs, no clicks, no rubs, no gallops RESP: Normal chest excursion without splinting or tachypnea; breath  sounds clear and equal bilaterally; no wheezes, no rhonchi, no rales, no hypoxia or respiratory distress, speaking full sentences ABD/GI: Normal bowel sounds; non-distended; soft, mild epigastric tenderness, no RUQ tenderness, negative Murphy's, no rebound, no guarding, no peritoneal signs, no hepatosplenomegaly BACK:  The back appears normal and is non-tender to palpation, there is no CVA tenderness EXT: Normal ROM in all joints; non-tender to palpation; no edema; normal capillary refill; no cyanosis, no calf tenderness or swelling    SKIN: Normal color for age and race; warm; no rash NEURO: Moves all extremities equally, sensation to light touch intact diffusely, cranial nerves II through XII intact, normal speech PSYCH: The patient's mood and manner are appropriate. Grooming and personal hygiene are appropriate.  MEDICAL DECISION MAKING: Patient here with minimal epigastric tenderness, symptoms suggestive of GERD, gastritis. We'll treat with Protonix, Zofran, GI cocktail and reassess. Labs have shown a mild leukocytosis otherwise unremarkable. Normal creatinine, LFTs and lipase. No sign of urinary tract infection. Abdominal exam benign. Doubt cholelithiasis, cholecystitis, pancreatitis, perforated ulcer, ACS.    ED PROGRESS: 2:52 AM Pt states he is feeling much better. Pt is comfortable with above plan and is stable for discharge at this time. Strict return precautions for return into the ED were discussed. We'll discharge with Carafate, omeprazole, Zofran.   At this time, I do not feel there is any life-threatening condition present. I have reviewed and discussed all results (EKG, imaging, lab, urine as appropriate) and exam findings with patient/family. I have reviewed nursing notes and appropriate previous records.  I feel the patient is safe to be discharged home without further emergent workup and can continue workup as an outpatient as needed. Discussed usual and customary return precautions.  Patient/family verbalize understanding and are comfortable with this plan.  Outpatient follow-up has been provided. All questions have been answered.   I personally performed the services described in this documentation, which was scribed in my presence. The recorded information has been reviewed and is accurate.    Layla MawKristen N Deeanna Beightol, DO 03/10/16 [phone]

## 2016-03-10 NOTE — ED Notes (Signed)
Pt. tolerated drinking Sprite.

## 2016-05-26 ENCOUNTER — Emergency Department (HOSPITAL_BASED_OUTPATIENT_CLINIC_OR_DEPARTMENT_OTHER): Payer: Self-pay

## 2016-05-26 ENCOUNTER — Encounter (HOSPITAL_BASED_OUTPATIENT_CLINIC_OR_DEPARTMENT_OTHER): Payer: Self-pay | Admitting: *Deleted

## 2016-05-26 ENCOUNTER — Emergency Department (HOSPITAL_BASED_OUTPATIENT_CLINIC_OR_DEPARTMENT_OTHER)
Admission: EM | Admit: 2016-05-26 | Discharge: 2016-05-26 | Disposition: A | Discharge status: Home / Self Care | Payer: Self-pay | Attending: Emergency Medicine | Admitting: Emergency Medicine

## 2016-05-26 DIAGNOSIS — Z79899 Other long term (current) drug therapy: Secondary | ICD-10-CM | POA: Insufficient documentation

## 2016-05-26 DIAGNOSIS — Z791 Long term (current) use of non-steroidal anti-inflammatories (NSAID): Secondary | ICD-10-CM | POA: Insufficient documentation

## 2016-05-26 DIAGNOSIS — J039 Acute tonsillitis, unspecified: Secondary | ICD-10-CM | POA: Insufficient documentation

## 2016-05-26 DIAGNOSIS — F17210 Nicotine dependence, cigarettes, uncomplicated: Secondary | ICD-10-CM | POA: Insufficient documentation

## 2016-05-26 LAB — CBC WITH DIFFERENTIAL/PLATELET
BASOS ABS: 0 10*3/uL (ref 0.0–0.1)
Basophils Relative: 0 %
Eosinophils Absolute: 0.2 10*3/uL (ref 0.0–0.7)
Eosinophils Relative: 2 %
HEMATOCRIT: 42.8 % (ref 39.0–52.0)
Hemoglobin: 14.4 g/dL (ref 13.0–17.0)
LYMPHS PCT: 11 %
Lymphs Abs: 1.7 10*3/uL (ref 0.7–4.0)
MCH: 32.1 pg (ref 26.0–34.0)
MCHC: 33.6 g/dL (ref 30.0–36.0)
MCV: 95.5 fL (ref 78.0–100.0)
Monocytes Absolute: 1.4 10*3/uL — ABNORMAL HIGH (ref 0.1–1.0)
Monocytes Relative: 9 %
NEUTROS ABS: 11.7 10*3/uL — AB (ref 1.7–7.7)
Neutrophils Relative %: 78 %
PLATELETS: 253 10*3/uL (ref 150–400)
RBC: 4.48 MIL/uL (ref 4.22–5.81)
RDW: 13.3 % (ref 11.5–15.5)
WBC: 15.1 10*3/uL — AB (ref 4.0–10.5)

## 2016-05-26 LAB — BASIC METABOLIC PANEL
ANION GAP: 8 (ref 5–15)
BUN: 19 mg/dL (ref 6–20)
CO2: 27 mmol/L (ref 22–32)
Calcium: 9.2 mg/dL (ref 8.9–10.3)
Chloride: 103 mmol/L (ref 101–111)
Creatinine, Ser: 0.86 mg/dL (ref 0.61–1.24)
GLUCOSE: 90 mg/dL (ref 65–99)
POTASSIUM: 4 mmol/L (ref 3.5–5.1)
Sodium: 138 mmol/L (ref 135–145)

## 2016-05-26 LAB — RAPID STREP SCREEN (MED CTR MEBANE ONLY): STREPTOCOCCUS, GROUP A SCREEN (DIRECT): NEGATIVE

## 2016-05-26 MED ORDER — LIDOCAINE VISCOUS 2 % MT SOLN
15.0000 mL | Freq: Once | OROMUCOSAL | Status: AC
  Administered 2016-05-26: 15 mL via OROMUCOSAL
  Filled 2016-05-26: qty 15

## 2016-05-26 MED ORDER — DEXAMETHASONE SODIUM PHOSPHATE 10 MG/ML IJ SOLN
10.0000 mg | Freq: Once | INTRAMUSCULAR | Status: AC
  Administered 2016-05-26: 10 mg via INTRAVENOUS
  Filled 2016-05-26: qty 1

## 2016-05-26 MED ORDER — CLINDAMYCIN HCL 150 MG PO CAPS: 300.0000 mg | ORAL_CAPSULE | Freq: Three times a day (TID) | ORAL | 0 refills | Status: AC

## 2016-05-26 MED ORDER — SODIUM CHLORIDE 0.9 % IV BOLUS (SEPSIS)
1000.0000 mL | Freq: Once | INTRAVENOUS | Status: AC
  Administered 2016-05-26: 1000 mL via INTRAVENOUS

## 2016-05-26 MED ORDER — IOPAMIDOL (ISOVUE-300) INJECTION 61%
100.0000 mL | Freq: Once | INTRAVENOUS | Status: AC | PRN
  Administered 2016-05-26: 80 mL via INTRAVENOUS

## 2016-05-26 NOTE — ED Provider Notes (Signed)
[facility] Provider Note   CSN: [id number] Arrival date & time: 05/26/16  1814  By signing my name below, I, Walter Miller, attest that this documentation has been prepared under the direction and in the presence of physician practitioner, Alvira MondayErin Jamesen Stahnke, MD. Electronically Signed: Linna Darnerussell Miller, Scribe. 05/26/2016. 6:49 PM.  History   Chief Complaint Chief Complaint  Patient presents with  . Sore Throat    The history is provided by the patient. No language interpreter was used.     HPI Comments: Walter Miller is a 36 y.o. male who presents to the Emergency Department complaining of a constant, 7/10 sore throat and intermittent tonsillar swelling for 2-3 days. He states his sore throat is exacerbated by swallowing. Patient states he has had these same symptoms intermittently for "my whole life" and was supposed to have a tonsillectomy when he was a child. He states he experiences a sore throat and tonsillar swelling roughly every 6-8 months. He also reports some left ear pain and dysphonia for the last 2-3 days. He reports he has not been eating and drinking as much as usual over the past 2-3 days d/t the severity of his sore throat. Pt denies fever, drooling, nausea, vomiting, abdominal pain, CP, SOB, cough, rhinorrhea, trouble swallowing, or any other associated symptoms.  Past Medical History:  Diagnosis Date  . GERD (gastroesophageal reflux disease)   . No pertinent past medical history     Patient Active Problem List   Diagnosis Date Noted  . GERD (gastroesophageal reflux disease) 04/20/2012  . Gastric ulcer 04/20/2012    Past Surgical History:  Procedure Laterality Date  . NO PAST SURGERIES         Home Medications    Prior to Admission medications   Medication Sig Start Date End Date Taking? Authorizing Provider  amoxicillin (AMOXIL) 500 MG capsule Take 1 capsule (500 mg total) by mouth 2 (two) times daily. 11/28/14   Blane OharaJoshua Zavitz, MD    clindamycin (CLEOCIN) 150 MG capsule Take 2 capsules (300 mg total) by mouth 3 (three) times daily. 05/26/16 06/02/16  Alvira MondayErin Desha Bitner, MD  cyclobenzaprine (FLEXERIL) 10 MG tablet Take 1 tablet (10 mg total) by mouth 2 (two) times daily as needed for muscle spasms. 01/11/16   Gwyneth SproutWhitney Plunkett, MD  lidocaine (LIDODERM) 5 % Place 1 patch onto the skin daily. Remove & Discard patch within 12 hours or as directed by MD 02/15/16   Anselm PancoastShawn C Joy, PA-C  methocarbamol (ROBAXIN) 500 MG tablet Take 1 tablet (500 mg total) by mouth 2 (two) times daily. 02/15/16   Shawn C Joy, PA-C  naproxen (NAPROSYN) 500 MG tablet Take 1 tablet (500 mg total) by mouth 2 (two) times daily. 01/11/16   Gwyneth SproutWhitney Plunkett, MD  naproxen (NAPROSYN) 500 MG tablet Take 1 tablet (500 mg total) by mouth 2 (two) times daily. 02/15/16   Shawn C Joy, PA-C  omeprazole (PRILOSEC) 40 MG capsule Take 1 capsule (40 mg total) by mouth daily. 03/10/16   Kristen N Ward, DO  oxyCODONE-acetaminophen (PERCOCET/ROXICET) 5-325 MG tablet Take 1-2 tablets by mouth every 6 (six) hours as needed for severe pain. 02/15/16   Shawn C Joy, PA-C  predniSONE (STERAPRED UNI-PAK 21 TAB) 10 MG (21) TBPK tablet Take 1 tablet (10 mg total) by mouth daily. Take 6 tabs day 1, 5 tabs day 2, 4 tabs day 3, 3 tabs day 4, 2 tabs day 5, and 1 tab on day 6. 02/15/16   Anselm PancoastShawn C Joy, PA-C  promethazine (PHENERGAN) 25 MG tablet Take 1 tablet (25 mg total) by mouth every 6 (six) hours as needed for nausea or vomiting. 03/10/16   Kristen N Ward, DO  sucralfate (CARAFATE) 1 GM/10ML suspension Take 10 mLs (1 g total) by mouth 4 (four) times daily -  with meals and at bedtime. 03/10/16   Kristen N Ward, DO  traMADol (ULTRAM) 50 MG tablet Take 1 tablet (50 mg total) by mouth every 6 (six) hours as needed. 11/28/14   Blane Ohara, MD    Family History No family history on file.  Social History Social History  Substance Use Topics  . Smoking status: Heavy Tobacco Smoker    Packs/day: 1.00     Types: Cigarettes  . Smokeless tobacco: Never Used  . Alcohol use No     Allergies   Patient has no known allergies.   Review of Systems Review of Systems  Constitutional: Negative for fever.  HENT: Positive for ear pain (L), sore throat and voice change. Negative for drooling, rhinorrhea and trouble swallowing.   Eyes: Negative for visual disturbance.  Respiratory: Negative for cough and shortness of breath.   Cardiovascular: Negative for chest pain.  Gastrointestinal: Negative for abdominal pain, nausea and vomiting.  Genitourinary: Negative for difficulty urinating.  Musculoskeletal: Negative for back pain and neck stiffness.  Skin: Negative for rash.  Neurological: Negative for syncope and headaches.     Physical Exam Updated Vital Signs BP 118/73   Pulse 71   Temp 98.3 F (36.8 C) (Oral)   Resp 18   Ht 5\' 9"  (1.753 m)   Wt 185 lb (83.9 kg)   SpO2 100%   BMI 27.32 kg/m   Physical Exam  Constitutional: He is oriented to person, place, and time. He appears well-developed and well-nourished. No distress.  HENT:  Head: Normocephalic and atraumatic.  Significant left tonsillar enlargement, Mild peritonsillar swelling. No uvular deviation.  Eyes: Conjunctivae and EOM are normal.  Neck: Normal range of motion. Neck supple. No tracheal deviation present.  Cardiovascular: Normal rate, regular rhythm, normal heart sounds and intact distal pulses.  Exam reveals no gallop and no friction rub.   No murmur heard. Pulmonary/Chest: Effort normal and breath sounds normal. No respiratory distress. He has no wheezes. He has no rales.  Abdominal: Soft. He exhibits no distension. There is no tenderness. There is no guarding.  Musculoskeletal: Normal range of motion. He exhibits no edema.  Neurological: He is alert and oriented to person, place, and time.  Skin: Skin is warm and dry. He is not diaphoretic.  Psychiatric: He has a normal mood and affect. His behavior is normal.    Nursing note and vitals reviewed.    ED Treatments / Results  Labs (all labs ordered are listed, but only abnormal results are displayed) Labs Reviewed  CBC WITH DIFFERENTIAL/PLATELET - Abnormal; Notable for the following:       Result Value   WBC 15.1 (*)    Neutro Abs 11.7 (*)    Monocytes Absolute 1.4 (*)    All other components within normal limits  RAPID STREP SCREEN (NOT AT [facility])  CULTURE, GROUP A STREP [facility])  BASIC METABOLIC PANEL    EKG  EKG Interpretation None       Radiology Ct Soft Tissue Neck W Contrast  Result Date: 05/26/2016 CLINICAL DATA:  Left-sided neck pain. Sore throat for 2 days. Difficulty swallowing. EXAM: CT NECK WITH CONTRAST TECHNIQUE: Multidetector CT imaging of the neck was performed using the  standard protocol following the bolus administration of intravenous contrast. CONTRAST:  80mL ISOVUE-300 IOPAMIDOL (ISOVUE-300) INJECTION 61% COMPARISON:  None. FINDINGS: Pharynx and larynx: The adenoid, palatine and lingual tonsils are all markedly enlarged. There is no associated fluid collection or abscess. There is no retropharyngeal abscess, diffusion or lymphadenopathy. The larynx is normal. Salivary glands: The parotid and submandibular glands are normal. No sialolithiasis or salivary ductal dilatation. Thyroid: Normal Lymph nodes: There are numerous bilateral cervical chain lymph nodes. On the right, the largest is at level IIa and measures 1.1 cm. On the left, the largest is also at level IIa, measuring 1.2 cm. Vascular: The major cervical vessels are normal. Limited intracranial: Normal. Visualized orbits: Normal. Mastoids and visualized paranasal sinuses: Clear. Skeleton: Normal. Upper chest: Clear Other: None. IMPRESSION: 1. Enlarged adenoid, palatine and lingual tonsils, consistent with tonsillopharyngitis. No peritonsillar for retropharyngeal abscess or other collection. 2. Reactive bilateral cervical lymphadenopathy. Electronically Signed   By: Deatra Robinson M.D.   On: 05/26/2016 20:56    Procedures Procedures (including critical care time)  DIAGNOSTIC STUDIES: Oxygen Saturation is 100% on RA, normal by my interpretation.    COORDINATION OF CARE: 6:58 PM Discussed treatment plan with pt at bedside and pt agreed to plan.  Medications Ordered in ED Medications  sodium chloride 0.9 % bolus 1,000 mL (0 mLs Intravenous Stopped 05/26/16 2050)  dexamethasone (DECADRON) injection 10 mg (10 mg Intravenous Given 05/26/16 1952)  lidocaine (XYLOCAINE) 2 % viscous mouth solution 15 mL (15 mLs Mouth/Throat Given 05/26/16 1951)  iopamidol (ISOVUE-300) 61 % injection 100 mL (80 mLs Intravenous Contrast Given 05/26/16 2039)     Initial Impression / Assessment and Plan / ED Course  I have reviewed the triage vital signs and the nursing notes.  Pertinent labs & imaging results that were available during my care of the patient were reviewed by me and considered in my medical decision making (see chart for details).     36yo male presents with concern for sore throat. Significant left tonsillar swelling and some peritonsillar swelling concerning for possible abscess, and CT ordered for further eval.  Pt given decadron, IV fluids, lidocaine with relief of pain.  CT shows no PTA, however tonsillitis. Given rx for clindamycin, discussed rec for ENT follow up if pt able given recurrent episodes. Patient discharged in stable condition with understanding of reasons to return.   Final Clinical Impressions(s) / ED Diagnoses   Final diagnoses:  Tonsillitis    New Prescriptions Discharge Medication List as of 05/26/2016 10:24 PM    START taking these medications   Details  clindamycin (CLEOCIN) 150 MG capsule Take 2 capsules (300 mg total) by mouth 3 (three) times daily., Starting Thu 05/26/2016, Until Thu 06/02/2016, Print       I personally performed the services described in this documentation, which was scribed in my presence. The recorded information  has been reviewed and is accurate.    Alvira Monday, MD 05/27/16 [phone]

## 2016-05-26 NOTE — ED Triage Notes (Signed)
Pt c/o sore throat x days. States he has been dealing with this off and on x 2 years. ENT recommended tonsils be removed but has not yet had that done. Denies fever

## 2016-05-27 ENCOUNTER — Telehealth (HOSPITAL_BASED_OUTPATIENT_CLINIC_OR_DEPARTMENT_OTHER): Payer: Self-pay | Admitting: Emergency Medicine

## 2016-05-28 LAB — CULTURE, GROUP A STREP (THRC)

## 2016-10-19 ENCOUNTER — Emergency Department (HOSPITAL_COMMUNITY)
Admission: EM | Admit: 2016-10-19 | Discharge: 2016-10-19 | Disposition: A | Discharge status: Home / Self Care | Payer: Self-pay | Attending: Emergency Medicine | Admitting: Emergency Medicine

## 2016-10-19 ENCOUNTER — Encounter (HOSPITAL_COMMUNITY): Payer: Self-pay | Admitting: Emergency Medicine

## 2016-10-19 DIAGNOSIS — Z91030 Bee allergy status: Secondary | ICD-10-CM | POA: Insufficient documentation

## 2016-10-19 DIAGNOSIS — T63441A Toxic effect of venom of bees, accidental (unintentional), initial encounter: Secondary | ICD-10-CM | POA: Insufficient documentation

## 2016-10-19 DIAGNOSIS — F17210 Nicotine dependence, cigarettes, uncomplicated: Secondary | ICD-10-CM | POA: Insufficient documentation

## 2016-10-19 DIAGNOSIS — T782XXA Anaphylactic shock, unspecified, initial encounter: Secondary | ICD-10-CM | POA: Insufficient documentation

## 2016-10-19 MED ORDER — EPINEPHRINE 0.3 MG/0.3ML IJ SOAJ
0.3000 mg | Freq: Once | INTRAMUSCULAR | Status: AC
  Administered 2016-10-19: 0.3 mg via INTRAMUSCULAR

## 2016-10-19 MED ORDER — RANITIDINE HCL 150 MG PO CAPS: 150.0000 mg | ORAL_CAPSULE | Freq: Two times a day (BID) | ORAL | 0 refills | Status: DC

## 2016-10-19 MED ORDER — ONDANSETRON HCL 4 MG/2ML IJ SOLN
4.0000 mg | Freq: Once | INTRAMUSCULAR | Status: AC
  Administered 2016-10-19: 4 mg via INTRAVENOUS
  Filled 2016-10-19: qty 2

## 2016-10-19 MED ORDER — EPINEPHRINE 0.3 MG/0.3ML IJ SOAJ: 0.3000 mg | Freq: Once | INTRAMUSCULAR | 0 refills | Status: AC

## 2016-10-19 MED ORDER — PREDNISONE 10 MG PO TABS: 40.0000 mg | ORAL_TABLET | Freq: Every day | ORAL | 0 refills | Status: AC

## 2016-10-19 MED ORDER — SODIUM CHLORIDE 0.9 % IV SOLN
INTRAVENOUS | Status: DC
  Administered 2016-10-19: 18:00:00 via INTRAVENOUS

## 2016-10-19 MED ORDER — DIPHENHYDRAMINE HCL 50 MG/ML IJ SOLN
50.0000 mg | Freq: Once | INTRAMUSCULAR | Status: AC
  Administered 2016-10-19: 50 mg via INTRAVENOUS

## 2016-10-19 MED ORDER — DIPHENHYDRAMINE HCL 25 MG PO CAPS: 25.0000 mg | ORAL_CAPSULE | Freq: Four times a day (QID) | ORAL | 0 refills | Status: DC

## 2016-10-19 MED ORDER — SODIUM CHLORIDE 0.9 % IV BOLUS (SEPSIS)
1000.0000 mL | Freq: Once | INTRAVENOUS | Status: AC
  Administered 2016-10-19: 1000 mL via INTRAVENOUS

## 2016-10-19 MED ORDER — EPINEPHRINE 0.3 MG/0.3ML IJ SOAJ
INTRAMUSCULAR | Status: AC
  Filled 2016-10-19: qty 0.3

## 2016-10-19 MED ORDER — DIPHENHYDRAMINE HCL 50 MG/ML IJ SOLN
INTRAMUSCULAR | Status: AC
  Filled 2016-10-19: qty 1

## 2016-10-19 MED ORDER — FAMOTIDINE IN NACL 20-0.9 MG/50ML-% IV SOLN
20.0000 mg | Freq: Once | INTRAVENOUS | Status: AC
  Administered 2016-10-19: 20 mg via INTRAVENOUS
  Filled 2016-10-19: qty 50

## 2016-10-19 MED ORDER — METHYLPREDNISOLONE SODIUM SUCC 125 MG IJ SOLR
125.0000 mg | Freq: Once | INTRAMUSCULAR | Status: AC
  Administered 2016-10-19: 125 mg via INTRAVENOUS
  Filled 2016-10-19: qty 2

## 2016-10-19 NOTE — ED Provider Notes (Signed)
[facility]-EMERGENCY DEPT Provider Note   CSN: [id number] Arrival date & time: 10/19/16  1619     History   Chief Complaint Chief Complaint  Patient presents with  . Allergic Reaction    HPI Walter Miller is a 36 y.o. male.  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 Reports severe burning rash and itching.  Reports some lightheadedness.  Has hx of allergies in the past but does not recall needing epi pen or having a reaction like this before. Symptoms began shortly after sting.  No difficulty swallowing, dysphonia or drooling.    Past Medical History:  Diagnosis Date  . GERD (gastroesophageal reflux disease)   . No pertinent past medical history     Patient Active Problem List   Diagnosis Date Noted  . GERD (gastroesophageal reflux disease) 04/20/2012  . Gastric ulcer 04/20/2012    Past Surgical History:  Procedure Laterality Date  . NO PAST SURGERIES         Home Medications    Prior to Admission medications   Medication Sig Start Date End Date Taking? Authorizing Provider  diphenhydrAMINE (BENADRYL) 25 mg capsule Take 1 capsule (25 mg total) by mouth every 6 (six) hours. 10/19/16 10/21/16  Alvira Monday, MD  predniSONE (DELTASONE) 10 MG tablet Take 4 tablets (40 mg total) by mouth daily. 10/19/16 10/22/16  Alvira Monday, MD  ranitidine (ZANTAC) 150 MG capsule Take 1 capsule (150 mg total) by mouth 2 (two) times daily. 10/19/16   Alvira Monday, MD  sucralfate (CARAFATE) 1 GM/10ML suspension Take 10 mLs (1 g total) by mouth 4 (four) times daily -  with meals and at bedtime. Patient not taking: Reported on 10/19/2016 03/10/16   Ward, Layla Maw, DO  traMADol (ULTRAM) 50 MG tablet Take 1 tablet (50 mg total) by mouth every 6 (six) hours as needed. Patient not taking: Reported on 10/19/2016 11/28/14   Blane Ohara, MD    Family History History reviewed. No  pertinent family history.  Social History Social History  Substance Use Topics  . Smoking status: Heavy Tobacco Smoker    Packs/day: 1.00    Types: Cigarettes  . Smokeless tobacco: Never Used  . Alcohol use No     Allergies   Bee venom   Review of Systems Review of Systems  Constitutional: Negative for fever.  HENT: Negative for sore throat.   Eyes: Negative for visual disturbance.  Respiratory: Negative for shortness of breath.   Cardiovascular: Negative for chest pain.  Gastrointestinal: Positive for abdominal pain, nausea and vomiting. Negative for diarrhea.  Genitourinary: Negative for difficulty urinating.  Musculoskeletal: Negative for back pain and neck stiffness.  Skin: Positive for rash.  Neurological: Positive for light-headedness. Negative for syncope and headaches.     Physical Exam Updated Vital Signs BP 118/83   Pulse 91   Temp 98.1 F (36.7 C) (Oral)   Resp 14   SpO2 100%   Physical Exam  Constitutional: He is oriented to person, place, and time. He appears well-developed and well-nourished. He appears distressed.  HENT:  Head: Normocephalic and atraumatic.  Eyes: Conjunctivae and EOM are normal.  Neck: Normal range of motion.  Cardiovascular: Normal rate, regular rhythm, normal heart sounds and intact distal pulses.  Exam reveals no gallop and no friction rub.   No murmur heard. Pulmonary/Chest: Effort normal. Tachypnea noted. No respiratory distress. He has decreased breath sounds. He  has no wheezes. He has no rales.  Abdominal: Soft. He exhibits no distension. There is no tenderness. There is no guarding.  Musculoskeletal: He exhibits no edema.  Neurological: He is alert and oriented to person, place, and time.  Skin: Skin is warm and dry. Rash noted. Rash is papular and urticarial. He is not diaphoretic.  Nursing note and vitals reviewed.    ED Treatments / Results  Labs (all labs ordered are listed, but only abnormal results are  displayed) Labs Reviewed - No data to display  EKG  EKG Interpretation None       Radiology No results found.  Procedures Procedures (including critical care time)  Medications Ordered in ED Medications  sodium chloride 0.9 % bolus 1,000 mL (0 mLs Intravenous Stopped 10/19/16 1752)  diphenhydrAMINE (BENADRYL) injection 50 mg (50 mg Intravenous Given 10/19/16 1649)  EPINEPHrine (EPI-PEN) injection 0.3 mg (0.3 mg Intramuscular Given 10/19/16 1650)  ondansetron (ZOFRAN) injection 4 mg (4 mg Intravenous Given 10/19/16 1650)  methylPREDNISolone sodium succinate (SOLU-MEDROL) 125 mg/2 mL injection 125 mg (125 mg Intravenous Given 10/19/16 1650)  famotidine (PEPCID) IVPB 20 mg premix (0 mg Intravenous Stopped 10/19/16 1752)   CRITICAL CARE: anaphylaxis Performed by: Rhae LernerSchlossman, Kenneth Lax Elizabeth   Total critical care time: 30 minutes  Critical care time was exclusive of separately billable procedures and treating other patients.  Critical care was necessary to treat or prevent imminent or life-threatening deterioration.  Critical care was time spent personally by me on the following activities: development of treatment plan with patient and/or surrogate as well as nursing, discussions with consultants, evaluation of patient's response to treatment, examination of patient, obtaining history from patient or surrogate, ordering and performing treatments and interventions, ordering and review of laboratory studies, ordering and review of radiographic studies, pulse oximetry and re-evaluation of patient's condition.   Initial Impression / Assessment and Plan / ED Course  I have reviewed the triage vital signs and the nursing notes.  Pertinent labs & imaging results that were available during my care of the patient were reviewed by me and considered in my medical decision making (see chart for details).     36yo male presents with concern for allergic reaction to bee sting with lightheadedness,  shortness of breath, nausea, and rash.  Initial blood pressures (while not recorded in computer) were in the 70s and 80s systolic. Patient with combination of symptoms consistent with anaphylaxis to bee sting.  Given IM epinephrine, benadryl, zantac, solumedrol with improvement in symptoms and blood pressures.  Monitored for 4 hours without rebound. Gave rx for epipen, benadryl, prednisone, ranitidine. Patient discharged in stable condition with understanding of reasons to return.   Final Clinical Impressions(s) / ED Diagnoses   Final diagnoses:  Anaphylaxis, initial encounter    New Prescriptions Discharge Medication List as of 10/19/2016  9:31 PM    START taking these medications   Details  diphenhydrAMINE (BENADRYL) 25 mg capsule Take 1 capsule (25 mg total) by mouth every 6 (six) hours., Starting Wed 10/19/2016, Until Fri 10/21/2016, Print    EPINEPHrine 0.3 mg/0.3 mL IJ SOAJ injection Inject 0.3 mLs (0.3 mg total) into the muscle once., Starting Wed 10/19/2016, Print    predniSONE (DELTASONE) 10 MG tablet Take 4 tablets (40 mg total) by mouth daily., Starting Wed 10/19/2016, Until Sat 10/22/2016, Print    ranitidine (ZANTAC) 150 MG capsule Take 1 capsule (150 mg total) by mouth 2 (two) times daily., Starting Wed 10/19/2016, Print  Alvira Monday, MD 10/20/16 [phone]

## 2016-10-19 NOTE — ED Triage Notes (Signed)
Pt presents with hives and body cramping and trouble breathing after being stung multiple times by a wasp/bee. Hx of swelling from this. Alert and oriented. Anxious.

## 2016-11-21 ENCOUNTER — Emergency Department (HOSPITAL_COMMUNITY): Payer: No Typology Code available for payment source

## 2016-11-21 ENCOUNTER — Emergency Department (HOSPITAL_COMMUNITY)
Admission: EM | Admit: 2016-11-21 | Discharge: 2016-11-21 | Disposition: A | Discharge status: Home / Self Care | Payer: No Typology Code available for payment source | Attending: Emergency Medicine | Admitting: Emergency Medicine

## 2016-11-21 DIAGNOSIS — Y999 Unspecified external cause status: Secondary | ICD-10-CM | POA: Insufficient documentation

## 2016-11-21 DIAGNOSIS — S5011XA Contusion of right forearm, initial encounter: Secondary | ICD-10-CM | POA: Diagnosis not present

## 2016-11-21 DIAGNOSIS — Y939 Activity, unspecified: Secondary | ICD-10-CM | POA: Insufficient documentation

## 2016-11-21 DIAGNOSIS — T07XXXA Unspecified multiple injuries, initial encounter: Secondary | ICD-10-CM | POA: Diagnosis present

## 2016-11-21 DIAGNOSIS — S5001XA Contusion of right elbow, initial encounter: Secondary | ICD-10-CM | POA: Insufficient documentation

## 2016-11-21 DIAGNOSIS — S20211A Contusion of right front wall of thorax, initial encounter: Secondary | ICD-10-CM | POA: Insufficient documentation

## 2016-11-21 DIAGNOSIS — Y929 Unspecified place or not applicable: Secondary | ICD-10-CM | POA: Diagnosis not present

## 2016-11-21 DIAGNOSIS — F17210 Nicotine dependence, cigarettes, uncomplicated: Secondary | ICD-10-CM | POA: Insufficient documentation

## 2016-11-21 MED ORDER — TRAMADOL HCL 50 MG PO TABS: 50.0000 mg | ORAL_TABLET | Freq: Four times a day (QID) | ORAL | 0 refills | Status: DC | PRN

## 2016-11-21 MED ORDER — OXYCODONE-ACETAMINOPHEN 5-325 MG PO TABS
1.0000 | ORAL_TABLET | Freq: Once | ORAL | Status: AC
  Administered 2016-11-21: 1 via ORAL
  Filled 2016-11-21: qty 1

## 2016-11-21 MED ORDER — DICLOFENAC SODIUM 50 MG PO TBEC: 50.0000 mg | DELAYED_RELEASE_TABLET | Freq: Two times a day (BID) | ORAL | 0 refills | Status: DC

## 2016-11-21 NOTE — Progress Notes (Signed)
CM noted pt has been to the ED 3 times in the last 6 months with no PCP and no ins.  Spoke with pt who stated he is homeless.  Asked pt [facility] and he had not.  Discussed with him that Lavinia SharpsMary Ann Placey could become his PCP and help with medications if ever needed.  Pt was agreeable to that and homeless resources.  Primary RN D/C pt before CM could consult CSW or provide any follow up information.  Pt was not found in lobby either.  CM will attempt to contact pt again if seen in the ED in the future.
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
## 2016-11-21 NOTE — Discharge Instructions (Signed)
Apply ice to the areas, take the medications as directed. Do not drive while taking the narcotic. Follow up with your doctor or return here as needed.

## 2016-11-21 NOTE — ED Notes (Signed)
Patient transported to X-ray 
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
## 2017-01-31 ENCOUNTER — Encounter (HOSPITAL_COMMUNITY): Payer: Self-pay | Admitting: *Deleted

## 2017-01-31 ENCOUNTER — Emergency Department (HOSPITAL_COMMUNITY)
Admission: EM | Admit: 2017-01-31 | Discharge: 2017-01-31 | Disposition: A | Discharge status: Home / Self Care | Payer: Self-pay | Attending: Emergency Medicine | Admitting: Emergency Medicine

## 2017-01-31 DIAGNOSIS — Z79899 Other long term (current) drug therapy: Secondary | ICD-10-CM | POA: Insufficient documentation

## 2017-01-31 DIAGNOSIS — F17210 Nicotine dependence, cigarettes, uncomplicated: Secondary | ICD-10-CM | POA: Insufficient documentation

## 2017-01-31 DIAGNOSIS — J039 Acute tonsillitis, unspecified: Secondary | ICD-10-CM | POA: Insufficient documentation

## 2017-01-31 MED ORDER — IBUPROFEN 800 MG PO TABS: 800.0000 mg | ORAL_TABLET | Freq: Three times a day (TID) | ORAL | 0 refills | Status: DC

## 2017-01-31 MED ORDER — DEXAMETHASONE SODIUM PHOSPHATE 10 MG/ML IJ SOLN
10.0000 mg | Freq: Once | INTRAMUSCULAR | Status: AC
  Administered 2017-01-31: 10 mg via INTRAMUSCULAR
  Filled 2017-01-31: qty 1

## 2017-01-31 MED ORDER — PENICILLIN G BENZATHINE 1200000 UNIT/2ML IM SUSP
1.2000 10*6.[IU] | Freq: Once | INTRAMUSCULAR | Status: AC
  Administered 2017-01-31: 1.2 10*6.[IU] via INTRAMUSCULAR
  Filled 2017-01-31: qty 2

## 2017-01-31 NOTE — ED Provider Notes (Signed)
[facility]-EMERGENCY DEPT Provider Note   CSN: [id number] Arrival date & time: 01/31/17  0245     History   Chief Complaint Chief Complaint  Patient presents with  . Oral Swelling    HPI Walter Miller is a 36 y.o. male.  Patient presents to the ER for evaluation of sore throat.  Patient reports that he has pain, swelling on the right side of his throat when he swallows.  He has noticed white patches.  Patient has not had a fever.  No cough or congestion.      Past Medical History:  Diagnosis Date  . GERD (gastroesophageal reflux disease)   . No pertinent past medical history     Patient Active Problem List   Diagnosis Date Noted  . GERD (gastroesophageal reflux disease) 04/20/2012  . Gastric ulcer 04/20/2012    Past Surgical History:  Procedure Laterality Date  . NO PAST SURGERIES         Home Medications    Prior to Admission medications   Medication Sig Start Date End Date Taking? Authorizing Provider  diclofenac (VOLTAREN) 50 MG EC tablet Take 1 tablet (50 mg total) by mouth 2 (two) times daily. 11/21/16   Janne Napoleon, NP  diphenhydrAMINE (BENADRYL) 25 mg capsule Take 1 capsule (25 mg total) by mouth every 6 (six) hours. 10/19/16 10/21/16  Alvira Monday, MD  ranitidine (ZANTAC) 150 MG capsule Take 1 capsule (150 mg total) by mouth 2 (two) times daily. 10/19/16   Alvira Monday, MD  sucralfate (CARAFATE) 1 GM/10ML suspension Take 10 mLs (1 g total) by mouth 4 (four) times daily -  with meals and at bedtime. Patient not taking: Reported on 10/19/2016 03/10/16   Ward, Layla Maw, DO  traMADol (ULTRAM) 50 MG tablet Take 1 tablet (50 mg total) by mouth every 6 (six) hours as needed. 11/21/16   Janne Napoleon, NP    Family History History reviewed. No pertinent family history.  Social History Social History  Substance Use Topics  . Smoking status: Heavy Tobacco Smoker    Packs/day: 1.00    Types: Cigarettes  . Smokeless tobacco:  Never Used  . Alcohol use No     Allergies   Bee venom   Review of Systems Review of Systems  HENT: Positive for sore throat.   All other systems reviewed and are negative.    Physical Exam Updated Vital Signs BP 127/77 (BP Location: Right Arm)   Pulse 88   Temp 98.4 F (36.9 C) (Oral)   Resp 20   Ht 5\' 8"  (1.727 m)   Wt 77.1 kg (170 lb)   SpO2 100%   BMI 25.85 kg/m   Physical Exam  Constitutional: He is oriented to person, place, and time. He appears well-developed and well-nourished. No distress.  HENT:  Head: Normocephalic and atraumatic.  Right Ear: Hearing normal.  Left Ear: Hearing normal.  Nose: Nose normal.  Mouth/Throat: Mucous membranes are normal. Oropharyngeal exudate, posterior oropharyngeal edema and posterior oropharyngeal erythema present. No tonsillar abscesses.  Eyes: Pupils are equal, round, and reactive to light. Conjunctivae and EOM are normal.  Neck: Normal range of motion. Neck supple.  Cardiovascular: Regular rhythm, S1 normal and S2 normal.  Exam reveals no gallop and no friction rub.   No murmur heard. Pulmonary/Chest: Effort normal and breath sounds normal. No respiratory distress. He exhibits no tenderness.  Abdominal: Soft. Normal appearance and bowel sounds are normal. There is no hepatosplenomegaly. There is no  tenderness. There is no rebound, no guarding, no tenderness at McBurney's point and negative Murphy's sign. No hernia.  Musculoskeletal: Normal range of motion.  Neurological: He is alert and oriented to person, place, and time. He has normal strength. No cranial nerve deficit or sensory deficit. Coordination normal. GCS eye subscore is 4. GCS verbal subscore is 5. GCS motor subscore is 6.  Skin: Skin is warm, dry and intact. No rash noted. No cyanosis.  Psychiatric: He has a normal mood and affect. His speech is normal and behavior is normal. Thought content normal.  Nursing note and vitals reviewed.    ED Treatments / Results    Labs (all labs ordered are listed, but only abnormal results are displayed) Labs Reviewed - No data to display  EKG  EKG Interpretation None       Radiology No results found.  Procedures Procedures (including critical care time)  Medications Ordered in ED Medications - No data to display   Initial Impression / Assessment and Plan / ED Course  I have reviewed the triage vital signs and the nursing notes.  Pertinent labs & imaging results that were available during my care of the patient were reviewed by me and considered in my medical decision making (see chart for details).     Patient with predominantly right-sided tonsillitis, no sign of peritonsillar abscess.  Final Clinical Impressions(s) / ED Diagnoses   Final diagnoses:  Tonsillitis    New Prescriptions New Prescriptions   No medications on file     Gilda CreasePollina, Dreya Buhrman J, MD 01/31/17 [phone]

## 2017-01-31 NOTE — ED Triage Notes (Signed)
Patient is alert and oriented x4.  He is from home and being seen for oral swelling.  Patient states that this time every year when the weather changes that his tonislls start to swell and becomes painful.  Currently he rates his pain 10 of 10.

## 2017-02-02 ENCOUNTER — Emergency Department (HOSPITAL_COMMUNITY): Admission: EM | Admit: 2017-02-02 | Discharge: 2017-02-02 | Discharge status: Against Medical Advice | Payer: Self-pay

## 2017-02-02 NOTE — ED Notes (Signed)
Pt not in lobby. LWBS before triage.

## 2017-08-20 ENCOUNTER — Emergency Department (HOSPITAL_COMMUNITY)
Admission: EM | Admit: 2017-08-20 | Discharge: 2017-08-20 | Disposition: A | Discharge status: Home / Self Care | Payer: Self-pay | Attending: Emergency Medicine | Admitting: Emergency Medicine

## 2017-08-20 ENCOUNTER — Encounter (HOSPITAL_COMMUNITY): Payer: Self-pay | Admitting: Emergency Medicine

## 2017-08-20 DIAGNOSIS — Z79899 Other long term (current) drug therapy: Secondary | ICD-10-CM | POA: Insufficient documentation

## 2017-08-20 DIAGNOSIS — F17210 Nicotine dependence, cigarettes, uncomplicated: Secondary | ICD-10-CM | POA: Insufficient documentation

## 2017-08-20 DIAGNOSIS — J020 Streptococcal pharyngitis: Secondary | ICD-10-CM | POA: Insufficient documentation

## 2017-08-20 LAB — GROUP A STREP BY PCR: GROUP A STREP BY PCR: DETECTED — AB

## 2017-08-20 MED ORDER — AMOXICILLIN 500 MG PO CAPS: 500.0000 mg | ORAL_CAPSULE | Freq: Three times a day (TID) | ORAL | 0 refills | Status: AC

## 2017-08-20 MED ORDER — AMOXICILLIN 500 MG PO CAPS
500.0000 mg | ORAL_CAPSULE | Freq: Once | ORAL | Status: AC
  Administered 2017-08-20: 500 mg via ORAL
  Filled 2017-08-20: qty 1

## 2017-08-20 MED ORDER — ACETAMINOPHEN 325 MG PO TABS
650.0000 mg | ORAL_TABLET | Freq: Once | ORAL | Status: AC | PRN
Start: 2017-08-20 — End: 2017-08-20
  Administered 2017-08-20: 650 mg via ORAL
  Filled 2017-08-20: qty 2

## 2017-08-20 NOTE — Discharge Instructions (Signed)
You have been seen today for sore throat.  The strep test was positive.    Please take all of your antibiotics until finished!   You may develop abdominal discomfort or diarrhea from the antibiotic.  You may help offset this with probiotics which you can buy or get in yogurt. Do not eat or take the probiotics until 2 hours after your antibiotic.   Hand washing: Wash your hands throughout the day, but especially before and after touching the face, using the restroom, sneezing, coughing, or touching surfaces that have been coughed or sneezed upon. Hydration: Symptoms will be intensified and complicated by dehydration. Dehydration can also extend the duration of symptoms. Drink plenty of fluids and get plenty of rest. You should be drinking at least half a liter of water an hour to stay hydrated. Electrolyte drinks (ex. Gatorade, Powerade, Pedialyte) are also encouraged. You should be drinking enough fluids to make your urine light yellow, almost clear. If this is not the case, you are not drinking enough water. Please note that some of the treatments indicated below will not be effective if you are not adequately hydrated. Diet: Please concentrate on hydration, however, you may introduce food slowly.  Start with a clear liquid diet, progressed to a full liquid diet, and then bland solids as you are able. Pain or fever: Ibuprofen, Naproxen, or Tylenol for pain or fever. (see below for suggested regimen) Antiinflammatory medications: Take 600 mg of ibuprofen every 6 hours or 440 mg (over the counter dose) to 500 mg (prescription dose) of naproxen every 12 hours for the next 3 days. After this time, these medications may be used as needed for pain. Take these medications with food to avoid upset stomach. Choose only one of these medications, do not take them together. Tylenol: Should you continue to have additional pain while taking the ibuprofen or naproxen, you may add in tylenol as needed. Your daily total  maximum amount of tylenol from all sources should be limited to /day for persons without liver problems, or /day for those with liver problems. Sore throat: Warm liquids or Chloraseptic spray may help soothe a sore throat. Gargle twice a day with a salt water solution made from a half teaspoon of salt in a cup of warm water.  Follow up: Follow up with a primary care provider, as needed, for any future management of this issue.

## 2017-08-20 NOTE — ED Notes (Signed)
See EDP assessment 

## 2017-08-20 NOTE — ED Notes (Signed)
Pt verbalizes understanding of d/c instructions. Pt received prescriptions. Pt ambulatory at d/c with all belongings.  

## 2017-08-20 NOTE — ED Triage Notes (Signed)
Patient presents to ED for swollen, sore throat x 2 days, with a hx of same "twice a year since I was a kid".  Redness and swelling noted to tonsils.  Pt c/o difficulty breathing when he goes to lay down at night.   Pt denies drooling.  Pt febrile at triage.

## 2017-08-20 NOTE — ED Provider Notes (Signed)
[facility] EMERGENCY DEPARTMENT Provider Note   CSN: [id number] Arrival date & time: 08/20/17  2013     History   Chief Complaint Chief Complaint  Patient presents with  . Sore Throat    HPI Walter Miller is a 37 y.o. male.  HPI   Walter MAZZUCA is a 37 y.o. male, with a history of strep throat, presenting to the ED with bilateral sore throat beginning yesterday.  Pain is moderate to severe, sharp, nonradiating.  States he typically is diagnosed with strep throat once or twice a year.  If his strep test is positive, he prefers oral antibiotic regimen over injection.  Accompanied by subjective fever and chills.  Denies difficulty swallowing or breathing, voice change, drooling, nausea/vomiting, cough, or any other complaints.    Past Medical History:  Diagnosis Date  . GERD (gastroesophageal reflux disease)   . No pertinent past medical history     Patient Active Problem List   Diagnosis Date Noted  . GERD (gastroesophageal reflux disease) 04/20/2012  . Gastric ulcer 04/20/2012    Past Surgical History:  Procedure Laterality Date  . NO PAST SURGERIES          Home Medications    Prior to Admission medications   Medication Sig Start Date End Date Taking? Authorizing Provider  amoxicillin (AMOXIL) 500 MG capsule Take 1 capsule (500 mg total) by mouth 3 (three) times daily for 10 days. 08/20/17 08/30/17  Joy, Shawn C, PA-C  diclofenac (VOLTAREN) 50 MG EC tablet Take 1 tablet (50 mg total) by mouth 2 (two) times daily. 11/21/16   Janne Napoleon, NP  diphenhydrAMINE (BENADRYL) 25 mg capsule Take 1 capsule (25 mg total) by mouth every 6 (six) hours. 10/19/16 10/21/16  Alvira Monday, MD  ibuprofen (ADVIL,MOTRIN) 800 MG tablet Take 1 tablet (800 mg total) by mouth 3 (three) times daily. 01/31/17   Gilda Crease, MD  ranitidine (ZANTAC) 150 MG capsule Take 1 capsule (150 mg total) by mouth 2 (two) times daily. 10/19/16   Alvira Monday, MD    sucralfate (CARAFATE) 1 GM/10ML suspension Take 10 mLs (1 g total) by mouth 4 (four) times daily -  with meals and at bedtime. Patient not taking: Reported on 10/19/2016 03/10/16   Ward, Layla Maw, DO  traMADol (ULTRAM) 50 MG tablet Take 1 tablet (50 mg total) by mouth every 6 (six) hours as needed. 11/21/16   Janne Napoleon, NP    Family History History reviewed. No pertinent family history.  Social History Social History   Tobacco Use  . Smoking status: Heavy Tobacco Smoker    Packs/day: 1.00    Types: Cigarettes  . Smokeless tobacco: Never Used  Substance Use Topics  . Alcohol use: No  . Drug use: No     Allergies   Bee venom   Review of Systems Review of Systems  Constitutional: Positive for chills and fever.  HENT: Positive for sore throat. Negative for trouble swallowing and voice change.   Respiratory: Negative for cough and shortness of breath.   Cardiovascular: Negative for chest pain.  Gastrointestinal: Negative for abdominal pain, diarrhea, nausea and vomiting.  Musculoskeletal: Negative for neck pain and neck stiffness.  All other systems reviewed and are negative.    Physical Exam Updated Vital Signs BP 122/76 (BP Location: Right Arm)   Pulse (!) 113   Temp (!) 100.8 F (38.2 C) (Oral)   Resp 16   SpO2 97%   Physical Exam  Constitutional: He appears well-developed and well-nourished. No distress.  HENT:  Head: Normocephalic and atraumatic.  Mouth/Throat: Uvula is midline. No trismus in the jaw. Oropharyngeal exudate, posterior oropharyngeal edema and posterior oropharyngeal erythema present.  No trismus.  Mouth opening to at least 3 finger widths.  Handles oral secretions without difficulty.  No noted facial swelling.  No swelling to the submental or submandibular regions.  No swelling or tenderness into the soft tissues of the neck.  Eyes: Conjunctivae are normal.  Neck: Normal range of motion. Neck supple.  Cardiovascular: Normal rate, regular  rhythm, normal heart sounds and intact distal pulses.  Pulmonary/Chest: Effort normal and breath sounds normal. No respiratory distress.  Abdominal: Soft. There is no tenderness. There is no guarding.  Musculoskeletal: He exhibits no edema.  Lymphadenopathy:    He has cervical adenopathy.  Neurological: He is alert.  Skin: Skin is warm and dry. He is not diaphoretic.  Psychiatric: He has a normal mood and affect. His behavior is normal.  Nursing note and vitals reviewed.    ED Treatments / Results  Labs (all labs ordered are listed, but only abnormal results are displayed) Labs Reviewed  GROUP A STREP BY PCR - Abnormal; Notable for the following components:      Result Value   Group A Strep by PCR DETECTED (*)    All other components within normal limits    EKG None  Radiology No results found.  Procedures Procedures (including critical care time)  Medications Ordered in ED Medications  acetaminophen (TYLENOL) tablet 650 mg (650 mg Oral Given 08/20/17 2028)  amoxicillin (AMOXIL) capsule 500 mg (500 mg Oral Given 08/20/17 2239)     Initial Impression / Assessment and Plan / ED Course  I have reviewed the triage vital signs and the nursing notes.  Pertinent labs & imaging results that were available during my care of the patient were reviewed by me and considered in my medical decision making (see chart for details).     Patient presents with a sore throat beginning yesterday.  Strep test positive here in the ED.  Patient initially febrile and tachycardic, resolved with Tylenol.  Low suspicion for abscess.  Patient has no noted airway or breathing difficulty. The patient was given instructions for home care as well as return precautions. Patient voices understanding of these instructions, accepts the plan, and is comfortable with discharge.  Vitals:   08/20/17 2021 08/20/17 2240  BP: 122/76 119/72  Pulse: (!) 113 99  Resp: 16 16  Temp: (!) 100.8 F (38.2 C) 99 F  (37.2 C)  TempSrc: Oral Oral  SpO2: 97% 100%     Final Clinical Impressions(s) / ED Diagnoses   Final diagnoses:  Strep throat    ED Discharge Orders        Ordered    amoxicillin (AMOXIL) 500 MG capsule  3 times daily     08/20/17 2229       Anselm Pancoast, PA-C 08/21/17 0058    Mancel Bale, MD 08/22/17 1002

## 2017-08-21 ENCOUNTER — Telehealth: Payer: Self-pay | Admitting: Emergency Medicine

## 2017-08-21 NOTE — Telephone Encounter (Signed)
Post ED Visit - Positive Culture Follow-up  Culture report reviewed by antimicrobial stewardship pharmacist:   Enzo Bi, Pharm.D.  Celedonio Miyamoto, [address].D., BCPS AQ-ID  Garvin Fila, Pharm.D., BCPS  Georgina Pillion, Pharm.D., BCPS  [place], [place].D., BCPS, AAHIVP  Estella Husk, Pharm.D., BCPS, AAHIVP  Lysle Pearl, PharmD, BCPS  Sherlynn Carbon, PharmD  Pollyann Samples, PharmD, BCPS  Positive strep culture Treated with amoxicillin, organism sensitive to the same and no further patient follow-up is required at this time.  Berle Mull 08/21/2017, 1:21 PM

## 2017-11-02 ENCOUNTER — Encounter (HOSPITAL_COMMUNITY): Payer: Self-pay

## 2017-11-02 ENCOUNTER — Emergency Department (HOSPITAL_COMMUNITY)
Admission: EM | Admit: 2017-11-02 | Discharge: 2017-11-02 | Disposition: A | Discharge status: Home / Self Care | Payer: Self-pay | Attending: Emergency Medicine | Admitting: Emergency Medicine

## 2017-11-02 DIAGNOSIS — F17210 Nicotine dependence, cigarettes, uncomplicated: Secondary | ICD-10-CM | POA: Insufficient documentation

## 2017-11-02 DIAGNOSIS — Z79899 Other long term (current) drug therapy: Secondary | ICD-10-CM | POA: Insufficient documentation

## 2017-11-02 DIAGNOSIS — R590 Localized enlarged lymph nodes: Secondary | ICD-10-CM | POA: Insufficient documentation

## 2017-11-02 MED ORDER — DOXYCYCLINE HYCLATE 100 MG PO CAPS: 100.0000 mg | ORAL_CAPSULE | Freq: Two times a day (BID) | ORAL | 0 refills | Status: DC

## 2017-11-02 NOTE — ED Notes (Signed)
Patient verbalizes understanding of medications and discharge instructions. No further questions at this time. VSS and patient ambulatory at discharge.   

## 2017-11-02 NOTE — ED Triage Notes (Signed)
Pt reports hernia in his L groin area for the past year, started hurting for the past two days and got larger.

## 2017-11-02 NOTE — ED Provider Notes (Signed)
[facility] EMERGENCY DEPARTMENT Provider Note   CSN: [id number] Arrival date & time: 11/02/17  0210     History   Chief Complaint Chief Complaint  Patient presents with  . Inguinal Hernia    HPI Walter Miller is a 37 y.o. male.  The history is provided by the patient.  He has a history of GERD, and comes in complaining of a painful swollen area in the left inguinal region.  He has noticed it for the past year, but it was very swollen and painful yesterday.  Swelling seems to have gone down.  He states that there have been 3 or 4 occasions in the past year when he got more swollen and more painful.  He denies any nausea or vomiting.  He denies fever or chills.  He works as a Designer, fashion/clothing, but it does not seem to get worse when he does lifting.  He denies any urinary difficulty.  Past Medical History:  Diagnosis Date  . GERD (gastroesophageal reflux disease)   . No pertinent past medical history     Patient Active Problem List   Diagnosis Date Noted  . GERD (gastroesophageal reflux disease) 04/20/2012  . Gastric ulcer 04/20/2012    Past Surgical History:  Procedure Laterality Date  . NO PAST SURGERIES          Home Medications    Prior to Admission medications   Medication Sig Start Date End Date Taking? Authorizing Provider  diclofenac (VOLTAREN) 50 MG EC tablet Take 1 tablet (50 mg total) by mouth 2 (two) times daily. 11/21/16   Janne Napoleon, NP  diphenhydrAMINE (BENADRYL) 25 mg capsule Take 1 capsule (25 mg total) by mouth every 6 (six) hours. 10/19/16 10/21/16  Alvira Monday, MD  ibuprofen (ADVIL,MOTRIN) 800 MG tablet Take 1 tablet (800 mg total) by mouth 3 (three) times daily. 01/31/17   Gilda Crease, MD  ranitidine (ZANTAC) 150 MG capsule Take 1 capsule (150 mg total) by mouth 2 (two) times daily. 10/19/16   Alvira Monday, MD  sucralfate (CARAFATE) 1 GM/10ML suspension Take 10 mLs (1 g total) by mouth 4 (four) times daily -  with meals  and at bedtime. Patient not taking: Reported on 10/19/2016 03/10/16   Ward, Layla Maw, DO  traMADol (ULTRAM) 50 MG tablet Take 1 tablet (50 mg total) by mouth every 6 (six) hours as needed. 11/21/16   Janne Napoleon, NP    Family History No family history on file.  Social History Social History   Tobacco Use  . Smoking status: Heavy Tobacco Smoker    Packs/day: 1.00    Types: Cigarettes  . Smokeless tobacco: Never Used  Substance Use Topics  . Alcohol use: No  . Drug use: No     Allergies   Bee venom   Review of Systems Review of Systems  All other systems reviewed and are negative.    Physical Exam Updated Vital Signs BP 118/89   Pulse 88   Temp 98 F (36.7 C)   Resp 20   SpO2 97%   Physical Exam  Nursing note and vitals reviewed.  37 year old male, resting comfortably and in no acute distress. Vital signs are normal. Oxygen saturation is 97%, which is normal. Head is normocephalic and atraumatic. PERRLA, EOMI. Oropharynx is clear. Neck is nontender and supple without adenopathy or JVD. Back is nontender and there is no CVA tenderness. Lungs are clear without rales, wheezes, or rhonchi. Chest is nontender.  Heart has regular rate and rhythm without murmur. Abdomen is soft, flat, nontender without masses or hepatosplenomegaly and peristalsis is normoactive. Genitalia: Circumcised penis.  Testes are descended without masses.  No inguinal hernia detected.  1 cm left inguinal lymph node present which is mildly tender to palpation.  It is rubbery in consistency and freely movable.  Mild or inguinal adenopathy is noted on the right side. Extremities have no cyanosis or edema, full range of motion is present. Skin is warm and dry without rash. Neurologic: Mental status is normal, cranial nerves are intact, there are no motor or sensory deficits.  ED Treatments / Results   Procedures Procedures   Medications Ordered in ED Medications - No data to  display   Initial Impression / Assessment and Plan / ED Course  I have reviewed the triage vital signs and the nursing notes.  Left inguinal lymph node.  No evidence of hernia - either direct or indirect.  No indication for laboratory work-up or imaging.  Diagnosis has been explained to the patient.  We will give a trial of doxycycline, but I doubt it will be effective given how long the lymph node has been enlarged.  He is given referral to urology if he has any further problems.  Final Clinical Impressions(s) / ED Diagnoses   Final diagnoses:  Inguinal adenopathy    ED Discharge Orders        Ordered    doxycycline (VIBRAMYCIN) 100 MG capsule  2 times daily,   Status:  Discontinued     11/02/17 0522    doxycycline (VIBRAMYCIN) 100 MG capsule  2 times daily     11/02/17 Pollie [id number]       Kamau Weatherall, MD 11/02/17 [phone]

## 2017-11-02 NOTE — Discharge Instructions (Addendum)
Your exam shows no sign of any hernias.  You have an enlarged lymph node. this may be related to some infection, so you are being given a course of antibiotics.  If it continues to bother you, please follow-up with the urologist.  In the meantime, take ibuprofen or naproxen for pain.  You may also take acetaminophen for additional pain relief.  Return if it seems like it is getting worse.

## 2017-11-10 ENCOUNTER — Emergency Department (HOSPITAL_COMMUNITY): Payer: Self-pay

## 2017-11-10 ENCOUNTER — Other Ambulatory Visit: Payer: Self-pay

## 2017-11-10 ENCOUNTER — Encounter (HOSPITAL_COMMUNITY): Payer: Self-pay | Admitting: Emergency Medicine

## 2017-11-10 ENCOUNTER — Emergency Department (HOSPITAL_COMMUNITY)
Admission: EM | Admit: 2017-11-10 | Discharge: 2017-11-10 | Disposition: A | Discharge status: Home / Self Care | Payer: Self-pay | Attending: Emergency Medicine | Admitting: Emergency Medicine

## 2017-11-10 DIAGNOSIS — F17210 Nicotine dependence, cigarettes, uncomplicated: Secondary | ICD-10-CM | POA: Insufficient documentation

## 2017-11-10 DIAGNOSIS — R590 Localized enlarged lymph nodes: Secondary | ICD-10-CM | POA: Insufficient documentation

## 2017-11-10 LAB — CBC WITH DIFFERENTIAL/PLATELET
BASOS ABS: 0 10*3/uL (ref 0.0–0.1)
BASOS PCT: 0 %
EOS ABS: 0.4 10*3/uL (ref 0.0–0.7)
Eosinophils Relative: 4 %
HCT: 44.7 % (ref 39.0–52.0)
HEMOGLOBIN: 14.8 g/dL (ref 13.0–17.0)
Lymphocytes Relative: 19 %
Lymphs Abs: 2 10*3/uL (ref 0.7–4.0)
MCH: 31.9 pg (ref 26.0–34.0)
MCHC: 33.1 g/dL (ref 30.0–36.0)
MCV: 96.3 fL (ref 78.0–100.0)
MONOS PCT: 9 %
Monocytes Absolute: 1 10*3/uL (ref 0.1–1.0)
NEUTROS PCT: 68 %
Neutro Abs: 7.2 10*3/uL (ref 1.7–7.7)
Platelets: 298 10*3/uL (ref 150–400)
RBC: 4.64 MIL/uL (ref 4.22–5.81)
RDW: 13.6 % (ref 11.5–15.5)
WBC: 10.6 10*3/uL — ABNORMAL HIGH (ref 4.0–10.5)

## 2017-11-10 LAB — COMPREHENSIVE METABOLIC PANEL
ALBUMIN: 3.8 g/dL (ref 3.5–5.0)
ALT: 11 U/L (ref 0–44)
ANION GAP: 6 (ref 5–15)
AST: 10 U/L — AB (ref 15–41)
Alkaline Phosphatase: 53 U/L (ref 38–126)
BUN: 17 mg/dL (ref 6–20)
CO2: 33 mmol/L — ABNORMAL HIGH (ref 22–32)
Calcium: 8.8 mg/dL — ABNORMAL LOW (ref 8.9–10.3)
Chloride: 100 mmol/L (ref 98–111)
Creatinine, Ser: 0.97 mg/dL (ref 0.61–1.24)
GFR calc non Af Amer: >60 mL/min (ref >60)
Glucose, Bld: 115 mg/dL — ABNORMAL HIGH (ref 70–99)
POTASSIUM: 3.5 mmol/L (ref 3.5–5.1)
SODIUM: 139 mmol/L (ref 135–145)
TOTAL PROTEIN: 7 g/dL (ref 6.5–8.1)
Total Bilirubin: 0.5 mg/dL (ref 0.3–1.2)

## 2017-11-10 LAB — RAPID HIV SCREEN (HIV 1/2 AB+AG)
HIV 1/2 Antibodies: NONREACTIVE
HIV-1 P24 Antigen - HIV24: NONREACTIVE

## 2017-11-10 MED ORDER — IOPAMIDOL (ISOVUE-300) INJECTION 61%
INTRAVENOUS | Status: AC
  Filled 2017-11-10: qty 100

## 2017-11-10 MED ORDER — IOPAMIDOL (ISOVUE-300) INJECTION 61%
100.0000 mL | Freq: Once | INTRAVENOUS | Status: AC | PRN
  Administered 2017-11-10: 100 mL via INTRAVENOUS

## 2017-11-10 NOTE — ED Triage Notes (Addendum)
Pt from home with c/o swollen lymph node in left groin area. Pt states it has been present x 3 years. Pt states he was seen for this on 7/25 and pain is worse than before. Pt is afebrile. Pt states pain shoots from groin to his left testicle

## 2017-11-10 NOTE — Discharge Instructions (Addendum)
Continue taking antibiotics as prescribed. Use anti-inflammatory such as ibuprofen, Aleve, or Advil to help with pain and swelling. He may try ice packs to help with pain and swelling. Follow-up with general surgery for further evaluation of the lesion. Return to the emergency room if you develop worsening symptoms, inability to urinate, swelling of your leg, fevers, or any new or concerning symptoms.

## 2017-11-10 NOTE — ED Provider Notes (Signed)
[facility]-EMERGENCY DEPT Provider Note   CSN: [id number] Arrival date & time: 11/10/17  0144     History   Chief Complaint Chief Complaint  Patient presents with  . Adenopathy    HPI Walter Miller is a 37 y.o. male presenting for evaluation of left groin adenopathy.  Patient states he has had a nodule in his left inguinal region for the past 2 to 3 years.  Over the past 2 months, and has grown.  He was evaluated in the ER last week, started on doxycycline.  In the past week, nodule has become more swollen and tender.  Additionally, patient states in the past 2 months, he has had approximately 20 pound weight loss and reports night sweats.  He denies fevers, chills, chest pain, shortness breath, nausea, vomiting, or abdominal pain.  He states that occasionally the pain from his left inguinal region shoots down to his left testicle.  He denies testicular swelling or lesions.  He denies penile discharge or penile pain.  He is sexually active with one male partner.  He has no other medical problems, does not take medications daily.  Patient smokes a pack of cigarettes a day, denies significant alcohol use.  Denies drug use.  Reports a family history of multiple cancers.  HPI  Past Medical History:  Diagnosis Date  . GERD (gastroesophageal reflux disease)   . No pertinent past medical history     Patient Active Problem List   Diagnosis Date Noted  . GERD (gastroesophageal reflux disease) 04/20/2012  . Gastric ulcer 04/20/2012    Past Surgical History:  Procedure Laterality Date  . NO PAST SURGERIES          Home Medications    Prior to Admission medications   Medication Sig Start Date End Date Taking? Authorizing Provider  doxycycline (VIBRAMYCIN) 100 MG capsule Take 1 capsule (100 mg total) by mouth 2 (two) times daily. 11/02/17  Yes Dione Booze, MD    Family History No family history on file.  Social History Social History   Tobacco Use  .  Smoking status: Heavy Tobacco Smoker    Packs/day: 1.00    Types: Cigarettes  . Smokeless tobacco: Never Used  Substance Use Topics  . Alcohol use: No  . Drug use: No     Allergies   Bee venom   Review of Systems Review of Systems  Constitutional: Positive for unexpected weight change.  Gastrointestinal:       Inguinal LAD  All other systems reviewed and are negative.    Physical Exam Updated Vital Signs BP 105/73 (BP Location: Left Arm)   Pulse 63   Temp 98.1 F (36.7 C) (Oral)   Resp 14   SpO2 96%   Physical Exam  Constitutional: He is oriented to person, place, and time. He appears well-developed and well-nourished. No distress.  Appears nontoxic  HENT:  Head: Normocephalic and atraumatic.  Eyes: Pupils are equal, round, and reactive to light. Conjunctivae and EOM are normal.  Neck: Normal range of motion. Neck supple.  Cardiovascular: Normal rate, regular rhythm and intact distal pulses.  Pulmonary/Chest: Effort normal and breath sounds normal. No respiratory distress. He has no wheezes.  Abdominal: Soft. He exhibits no distension and no mass. There is no tenderness. There is no rebound and no guarding.  Genitourinary: Testes normal and penis normal. No discharge found.     Musculoskeletal: Normal range of motion.  Neurological: He is alert and oriented to person, place,  and time.  Skin: Skin is warm and dry.  Psychiatric: He has a normal mood and affect.  Nursing note and vitals reviewed.    ED Treatments / Results  Labs (all labs ordered are listed, but only abnormal results are displayed) Labs Reviewed  CBC WITH DIFFERENTIAL/PLATELET - Abnormal; Notable for the following components:      Result Value   WBC 10.6 (*)    All other components within normal limits  COMPREHENSIVE METABOLIC PANEL - Abnormal; Notable for the following components:   CO2 33 (*)    Glucose, Bld 115 (*)    Calcium 8.8 (*)    AST 10 (*)    All other components within  normal limits  RAPID HIV SCREEN (HIV 1/2 AB+AG)    EKG None  Radiology Ct Pelvis W Contrast  Result Date: 11/10/2017 CLINICAL DATA:  Enlarged lymph nodes, pelvic/inguinal. Patient reports swelling noted in left groin for 3 years. EXAM: CT PELVIS WITH CONTRAST TECHNIQUE: Multidetector CT imaging of the pelvis was performed using the standard protocol following the bolus administration of intravenous contrast. CONTRAST:  ISOVUE-300 IOPAMIDOL (ISOVUE-300) INJECTION 61% COMPARISON:  Abdominopelvic CT 04/20/2012 FINDINGS: Urinary Tract: Distal ureters are decompressed. Urinary bladder is physiologically distended. No bladder wall thickening. Bowel:  Pelvic bowel loops are unremarkable. Vascular/Lymphatic: Lobulated 2.4 x 2.0 x 2.4 cm lymph node in the left groin has heterogeneous density, some internal low-density components and scattered calcifications as well as loss of normal reniform nodal morphology. Questionable mild perinodal edema. A calcified node was previously seen in this region measuring 1.3 x 0.8 x 1.2 cm. Few adjacent left inguinal nodes measure up to 9 mm short axis. No left iliac adenopathy. Few prominent but not pathologically enlarged right inguinal and external iliac nodes. Pelvic vascular structures are unremarkable. Reproductive:  Prostate gland is unremarkable. Other:  No pelvic free fluid. 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 There are no acute or suspicious osseous abnormalities. IMPRESSION: 1. Enlarged rounded left inguinal node measuring 2.4 x 2 x 2.4 cm, heterogeneous in density with coarse calcifications. Question mild perinodal edema. Calcified lymph node at this site on CT 5 years prior measured 1.3 x 0.8 x 1.2 cm at that time. Cause for increased size and perinodal edema may be infectious or inflammatory, but is indeterminate by CT and malignancy is not excluded on imaging findings  alone. Consider tissue sampling. 2. Additional small bilateral inguinal nodes, none of which are enlarged by size criteria. 3. 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 Electronically Signed   By: Rubye Oaks M.D.   On: 11/10/2017 06:33      EMERGENCY DEPARTMENT US SOFT TISSUE INTERPRETATION "Study: Limited Soft Tissue Ultrasound"  INDICATIONS: Pain Multiple views of the body part were obtained in real-time with a multi-frequency linear probe  PERFORMED BY: Myself IMAGES ARCHIVED?: Yes SIDE:Left BODY PART:L groin INTERPRETATION:  well circumscribed nodule ~1.4x2.2cm.      Procedures Procedures (including critical care time)  Medications Ordered in ED Medications  iopamidol (ISOVUE-300) 61 % injection (has no administration in time range)  iopamidol (ISOVUE-300) 61 % injection 100 mL (100 mLs Intravenous Contrast Given 11/10/17 0559)     Initial Impression / Assessment and Plan / ED Course  I have reviewed the triage vital signs and the nursing notes.  Pertinent labs & imaging results that were available during my care of the patient were reviewed by  me and considered in my medical decision making (see chart for details).     Patient presenting for evaluation of left groin swelling and pain.  History concerning, patient has associated weight loss and increase in size over the past several months.  Concern for cancer versus infection.  No penile discharge or signs of STD/ulcers.  Will obtain labs and HIV screen and reassess.  Case discussed with attending, Dr. Rhunette CroftNanavati evaluated the patient.  Bedside ultrasound shows well-circumscribed lesion of the left inguinal region that does not appear fluid-filled.  Will obtain CT pelvis for further evaluation.  Labs show mild leukocytosis at 10.6, doubt infection.  Otherwise reassuring.  Rapid HIV negative.  CT pelvis shows lesion, concerning for infection versus inflammation versus  malignancy.  We will have patient follow-up with general surgery for further testing of the lesion.  Patient to continue doxycycline.  If symptoms continue to worsen, consider need for hospitalization for further evaluation of a rapidly progressing lesion.  Discussed with patient, who is agreeable to plan.  At this time, patient appears safe for discharge.  Strict return precautions given.  Patient states he understands.   Final Clinical Impressions(s) / ED Diagnoses   Final diagnoses:  Inguinal lymphadenopathy    ED Discharge Orders    None       Alveria ApleyCaccavale, Lakoda Mcanany, PA-C 11/10/17 0720    Derwood KaplanNanavati, Ankit, MD 11/10/17 [phone]

## 2017-11-10 NOTE — ED Notes (Signed)
Pt reports pain with movement left groin area denies pain at rest.

## 2017-11-17 ENCOUNTER — Other Ambulatory Visit: Payer: Self-pay

## 2017-11-17 ENCOUNTER — Emergency Department (HOSPITAL_COMMUNITY): Admission: EM | Admit: 2017-11-17 | Discharge: 2017-11-17 | Discharge status: Absent without leave | Payer: Self-pay

## 2017-11-17 NOTE — ED Notes (Signed)
Patient called three times for triage with no answer.

## 2018-04-16 ENCOUNTER — Emergency Department (HOSPITAL_COMMUNITY)
Admission: EM | Admit: 2018-04-16 | Discharge: 2018-04-16 | Disposition: A | Discharge status: Home / Self Care | Payer: No Typology Code available for payment source | Attending: Emergency Medicine | Admitting: Emergency Medicine

## 2018-04-16 ENCOUNTER — Encounter (HOSPITAL_COMMUNITY): Payer: Self-pay | Admitting: *Deleted

## 2018-04-16 ENCOUNTER — Emergency Department (HOSPITAL_COMMUNITY): Payer: No Typology Code available for payment source

## 2018-04-16 DIAGNOSIS — M25561 Pain in right knee: Secondary | ICD-10-CM | POA: Diagnosis not present

## 2018-04-16 DIAGNOSIS — Y9389 Activity, other specified: Secondary | ICD-10-CM | POA: Diagnosis not present

## 2018-04-16 DIAGNOSIS — S060X9A Concussion with loss of consciousness of unspecified duration, initial encounter: Secondary | ICD-10-CM

## 2018-04-16 DIAGNOSIS — S060X0A Concussion without loss of consciousness, initial encounter: Secondary | ICD-10-CM | POA: Insufficient documentation

## 2018-04-16 DIAGNOSIS — F17210 Nicotine dependence, cigarettes, uncomplicated: Secondary | ICD-10-CM | POA: Insufficient documentation

## 2018-04-16 DIAGNOSIS — Y929 Unspecified place or not applicable: Secondary | ICD-10-CM | POA: Diagnosis not present

## 2018-04-16 DIAGNOSIS — Y999 Unspecified external cause status: Secondary | ICD-10-CM | POA: Diagnosis not present

## 2018-04-16 MED ORDER — CYCLOBENZAPRINE HCL 10 MG PO TABS: 10.0000 mg | ORAL_TABLET | Freq: Two times a day (BID) | ORAL | 0 refills | Status: DC | PRN

## 2018-04-16 MED ORDER — ONDANSETRON 4 MG PO TBDP: 4.0000 mg | ORAL_TABLET | Freq: Three times a day (TID) | ORAL | 0 refills | Status: DC | PRN

## 2018-04-16 NOTE — ED Notes (Signed)
ED Provider at bedside. 
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
## 2018-08-04 ENCOUNTER — Other Ambulatory Visit: Payer: Self-pay

## 2018-08-04 ENCOUNTER — Encounter (HOSPITAL_COMMUNITY): Payer: Self-pay

## 2018-08-04 ENCOUNTER — Ambulatory Visit (HOSPITAL_COMMUNITY)
Admission: EM | Admit: 2018-08-04 | Discharge: 2018-08-04 | Disposition: A | Discharge status: Home / Self Care | Payer: Self-pay | Attending: Family Medicine | Admitting: Family Medicine

## 2018-08-04 DIAGNOSIS — J020 Streptococcal pharyngitis: Secondary | ICD-10-CM

## 2018-08-04 LAB — POCT RAPID STREP A: Streptococcus, Group A Screen (Direct): POSITIVE — AB

## 2018-08-04 MED ORDER — AMOXICILLIN 875 MG PO TABS: 875.0000 mg | ORAL_TABLET | Freq: Two times a day (BID) | ORAL | 0 refills | Status: AC

## 2018-08-04 NOTE — Discharge Instructions (Signed)
You may use over the counter ibuprofen or acetaminophen as needed.  °For a sore throat, over the counter products such as [employer] Peroxyl Mouth Sore Rinse or Chloraseptic Sore Throat Spray may provide some temporary relief. ° ° ° ° °

## 2018-08-04 NOTE — ED Triage Notes (Signed)
Pt states he has sore throat and he has some white spots in the back of his throat. It's been 3 days .

## 2018-08-08 NOTE — ED Provider Notes (Signed)
[facility]   [id number] 08/04/18 Arrival Time: 1529  ASSESSMENT & PLAN:  1. Strep pharyngitis     Meds ordered this encounter  Medications   amoxicillin (AMOXIL) 875 MG tablet    Sig: Take 1 tablet (875 mg total) by mouth 2 (two) times daily for 10 days.    Dispense:  20 tablet    Refill:  0   Labs Reviewed  POCT RAPID STREP A - Abnormal; Notable for the following components:      Result Value   Streptococcus, Group A Screen (Direct) POSITIVE (*)    All other components within normal limits   OTC analgesics and throat care as needed  Instructed to finish full 10 day course of antibiotics. Will follow up if not showing significant improvement over the next 24-48 hours.    Discharge Instructions      You may use over the counter ibuprofen or acetaminophen as needed.   For a sore throat, over the counter products such as [employer] Peroxyl Mouth Sore Rinse or Chloraseptic Sore Throat Spray may provide some temporary relief.    Reviewed expectations re: course of current medical issues. Questions answered. Outlined signs and symptoms indicating need for more acute intervention. Patient verbalized understanding. After Visit Summary given.   SUBJECTIVE:  Walter Miller is a 38 y.o. male who reports a sore throat. Describes as pain with swallowing. Onset abrupt beginning 1 day ago. No respiratory symptoms. Normal PO intake but reports discomfort with swallowing. Fever reported: yes, subjective with chills. No neck pain or swelling. No associated n/v/abdominal symptoms. Sick contacts: none known.  OTC treatment: OTC analgesics with mild help.  ROS: As per HPI.   OBJECTIVE:  Vitals:   08/04/18 1542 08/04/18 1543  BP:  127/78  Pulse:  89  Resp:  18  Temp:  98.1 F (36.7 C)  TempSrc:  Oral  SpO2:  100%  Weight: 81.6 kg     General appearance: alert; no distress HEENT: throat with tonsillar hypertrophy, moderate erythema and exudates present; uvula  midline: yes Neck: supple with FROM; small bilateral cervical LAD CV: RRR Lungs: clear to auscultation bilaterally Abd: soft; non-tender Skin: reveals no rash; warm and dry Psychological: alert and cooperative; normal mood and affect  Allergies  Allergen Reactions   Bee Venom     Swelling     Past Medical History:  Diagnosis Date   GERD (gastroesophageal reflux disease)    No pertinent past medical history    Social History   Socioeconomic History   Marital status: Single    Spouse name: Not on file   Number of children: Not on file   Years of education: Not on file   Highest education level: Not on file  Occupational History   Not on file  Social Needs   Financial resource strain: Not on file   Food insecurity:    Worry: Not on file    Inability: Not on file   Transportation needs:    Medical: Not on file    Non-medical: Not on file  Tobacco Use   Smoking status: Heavy Tobacco Smoker    Packs/day: 1.00    Types: Cigarettes   Smokeless tobacco: Never Used  Substance and Sexual Activity   Alcohol use: No   Drug use: No   Sexual activity: Yes  Lifestyle   Physical activity:    Days per week: Not on file    Minutes per session: Not on file   Stress: Not  on file  Relationships   Social connections:    Talks on phone: Not on file    Gets together: Not on file    Attends religious service: Not on file    Active member of club or organization: Not on file    Attends meetings of clubs or organizations: Not on file    Relationship status: Not on file   Intimate partner violence:    Fear of current or ex partner: Not on file    Emotionally abused: Not on file    Physically abused: Not on file    Forced sexual activity: Not on file  Other Topics Concern   Not on file  Social History Narrative   Not on file   History reviewed. No pertinent family history.        Mardella LaymanHagler, Jayra Choyce, MD 08/08/18 [phone]

## 2019-05-16 ENCOUNTER — Emergency Department (HOSPITAL_COMMUNITY)
Admission: EM | Admit: 2019-05-16 | Discharge: 2019-05-16 | Disposition: A | Discharge status: Home / Self Care | Payer: Self-pay | Attending: Emergency Medicine | Admitting: Emergency Medicine

## 2019-05-16 ENCOUNTER — Encounter (HOSPITAL_COMMUNITY): Payer: Self-pay | Admitting: Emergency Medicine

## 2019-05-16 ENCOUNTER — Other Ambulatory Visit: Payer: Self-pay

## 2019-05-16 DIAGNOSIS — J020 Streptococcal pharyngitis: Secondary | ICD-10-CM | POA: Insufficient documentation

## 2019-05-16 DIAGNOSIS — F17210 Nicotine dependence, cigarettes, uncomplicated: Secondary | ICD-10-CM | POA: Insufficient documentation

## 2019-05-16 DIAGNOSIS — Z79899 Other long term (current) drug therapy: Secondary | ICD-10-CM | POA: Insufficient documentation

## 2019-05-16 LAB — GROUP A STREP BY PCR: Group A Strep by PCR: DETECTED — AB

## 2019-05-16 MED ORDER — DEXAMETHASONE SODIUM PHOSPHATE 10 MG/ML IJ SOLN
10.0000 mg | Freq: Once | INTRAMUSCULAR | Status: AC
  Administered 2019-05-16: 10 mg via INTRAMUSCULAR
  Filled 2019-05-16: qty 1

## 2019-05-16 MED ORDER — ACETAMINOPHEN 325 MG PO TABS
650.0000 mg | ORAL_TABLET | Freq: Once | ORAL | Status: AC
  Administered 2019-05-16: 11:00:00 650 mg via ORAL
  Filled 2019-05-16: qty 2

## 2019-05-16 MED ORDER — PENICILLIN V POTASSIUM 500 MG PO TABS: 500.0000 mg | ORAL_TABLET | Freq: Two times a day (BID) | ORAL | 0 refills | Status: AC

## 2019-05-16 NOTE — ED Triage Notes (Signed)
Pt c/o swollen tonsils for couple days. Reports painful to swallow.

## 2019-05-16 NOTE — ED Provider Notes (Signed)
[facility] DEPT Provider Note   CSN: [id number] Arrival date & time: 05/16/19  6073     History Chief Complaint  Patient presents with  . Sore Throat    Walter Miller is a 39 y.o. male who presents to the ED today with complaint of gradual onset, constant, achy, 10/10, sore throat x 2 days. Pt complains of swollen tonsils as well. He reports it is very painful to swallow however states he is able to swallow liquids if he "forces it." Able to tolerate secretions without difficulty. Pt has taken 600 mg Ibuprofen once with mild relief. He reports about once per year he gets strep throat and this feels similar. States he has had subjective fevers. Denies chills, voice change, trismus, inability to swallow, or any other associated symptoms.   The history is provided by the patient and medical records.       Past Medical History:  Diagnosis Date  . GERD (gastroesophageal reflux disease)   . No pertinent past medical history     Patient Active Problem List   Diagnosis Date Noted  . GERD (gastroesophageal reflux disease) 04/20/2012  . Gastric ulcer 04/20/2012    Past Surgical History:  Procedure Laterality Date  . NO PAST SURGERIES         No family history on file.  Social History   Tobacco Use  . Smoking status: Heavy Tobacco Smoker    Packs/day: 1.00    Types: Cigarettes  . Smokeless tobacco: Never Used  Substance Use Topics  . Alcohol use: No  . Drug use: No    Home Medications Prior to Admission medications   Medication Sig Start Date End Date Taking? Authorizing Provider  cyclobenzaprine (FLEXERIL) 10 MG tablet Take 1 tablet (10 mg total) by mouth 2 (two) times daily as needed for muscle spasms. 04/16/18   Maudie Flakes, MD  ondansetron (ZOFRAN ODT) 4 MG disintegrating tablet Take 1 tablet (4 mg total) by mouth every 8 (eight) hours as needed for nausea or vomiting. 04/16/18   Maudie Flakes, MD  penicillin v potassium (VEETID)  500 MG tablet Take 1 tablet (500 mg total) by mouth 2 (two) times daily for 7 days. 05/16/19 05/23/19  Eustaquio Maize, PA-C    Allergies    Bee venom  Review of Systems   Review of Systems  Constitutional: Positive for fever (subjective). Negative for chills.  HENT: Positive for sore throat.     Physical Exam Updated Vital Signs BP 127/82   Pulse 100   Temp 97.9 F (36.6 C) (Oral)   Resp 17   SpO2 99%   Physical Exam Vitals and nursing note reviewed.  Constitutional:      Appearance: He is not ill-appearing.  HENT:     Head: Normocephalic and atraumatic.     Mouth/Throat:     Pharynx: Uvula midline. Posterior oropharyngeal erythema present.     Tonsils: Tonsillar exudate present. 1+ on the right. 1+ on the left.  Eyes:     Conjunctiva/sclera: Conjunctivae normal.  Cardiovascular:     Rate and Rhythm: Normal rate and regular rhythm.  Pulmonary:     Effort: Pulmonary effort is normal.     Breath sounds: Normal breath sounds.  Skin:    General: Skin is warm and dry.     Coloration: Skin is not jaundiced.  Neurological:     Mental Status: He is alert.     ED Results / Procedures / Treatments  Labs (all labs ordered are listed, but only abnormal results are displayed) Labs Reviewed  GROUP A STREP BY PCR - Abnormal; Notable for the following components:      Result Value   Group A Strep by PCR DETECTED (*)    All other components within normal limits    EKG None  Radiology No results found.  Procedures Procedures (including critical care time)  Medications Ordered in ED Medications  dexamethasone (DECADRON) injection 10 mg (has no administration in time range)  acetaminophen (TYLENOL) tablet 650 mg (650 mg Oral Given 05/16/19 1041)    ED Course  I have reviewed the triage vital signs and the nursing notes.  Pertinent labs & imaging results that were available during my care of the patient were reviewed by me and considered in my medical decision making  (see chart for details).  Clinical Course as of May 16 1047  Thu May 16, 2019  1036 Group A Strep by PCR(!): DETECTED [MV]    Clinical Course User Index [MV] Tanda Rockers, PA-C   39 year old male who presents to the ED complaining of a sore throat for the past 2 days with painful swallowing however able to tolerate secretions.  On arrival to the ED patient's temp 97.9, mildly tachycardic at 100, nontachypneic.  Patient's uvula is midline.  He has bilateral tonsillar hypertrophy with exudate.  Swab for strep at this time.  Patient states he has a history of same and this feels similar, most recent April 2020.   Strep positive.  Patient given Tylenol in the ED and have visualized him drinking liquids without difficulty.  Will give shot of Decadron as well to help with inflammation.  Have discussed antibiotic options with patient, he declined one-time dose of penicillin here and requesting outpatient medications.  Will oblige.   Pt discharged home with penicillin. Strict return precautions have been discussed. Pt is in agreement with plan and stable for discharge home.   This note was prepared using Dragon voice recognition software and may include unintentional dictation errors due to the inherent limitations of voice recognition software.   MDM Rules/Calculators/A&P                      Final Clinical Impression(s) / ED Diagnoses Final diagnoses:  Strep pharyngitis    Rx / DC Orders ED Discharge Orders         Ordered    penicillin v potassium (VEETID) 500 MG tablet  2 times daily     05/16/19 1048           Discharge Instructions     You tested POSITIVE for strep today. Please take antibiotics as prescribed.   Take Tylenol and Ibuprofen as needed for pain. Drink plenty of fluids to stay hydrated. Eat soft foods if it is painful to swallow.   Cough drops and chloraseptic spray OTC will help your throat discomfort as well.   Return to the ED IMMEDIATELY for any worsening  symptoms including no improvement after 48 hours on antibiotics, worsening pain, inability to swallow your own saliva, neck pain/stiffness, or any other concerning symptoms.        Tanda Rockers, PA-C 05/16/19 1049    Melene Plan, DO 05/16/19 1103

## 2019-05-16 NOTE — Discharge Instructions (Addendum)
You tested POSITIVE for strep today. Please take antibiotics as prescribed.   Take Tylenol and Ibuprofen as needed for pain. Drink plenty of fluids to stay hydrated. Eat soft foods if it is painful to swallow.   Cough drops and chloraseptic spray OTC will help your throat discomfort as well.   Return to the ED IMMEDIATELY for any worsening symptoms including no improvement after 48 hours on antibiotics, worsening pain, inability to swallow your own saliva, neck pain/stiffness, or any other concerning symptoms.

## 2019-09-02 ENCOUNTER — Emergency Department (HOSPITAL_COMMUNITY)
Admission: EM | Admit: 2019-09-02 | Discharge: 2019-09-03 | Discharge status: Against Medical Advice | Payer: Self-pay | Attending: Emergency Medicine | Admitting: Emergency Medicine

## 2019-09-02 ENCOUNTER — Encounter (HOSPITAL_COMMUNITY): Payer: Self-pay

## 2019-09-02 ENCOUNTER — Other Ambulatory Visit: Payer: Self-pay

## 2019-09-02 ENCOUNTER — Emergency Department (HOSPITAL_COMMUNITY)
Admission: EM | Admit: 2019-09-02 | Discharge: 2019-09-02 | Disposition: A | Discharge status: Home / Self Care | Payer: Self-pay | Attending: Emergency Medicine | Admitting: Emergency Medicine

## 2019-09-02 ENCOUNTER — Encounter (HOSPITAL_COMMUNITY): Payer: Self-pay | Admitting: Emergency Medicine

## 2019-09-02 ENCOUNTER — Encounter (HOSPITAL_COMMUNITY): Payer: Self-pay | Admitting: *Deleted

## 2019-09-02 DIAGNOSIS — R220 Localized swelling, mass and lump, head: Secondary | ICD-10-CM | POA: Insufficient documentation

## 2019-09-02 DIAGNOSIS — Z5321 Procedure and treatment not carried out due to patient leaving prior to being seen by health care provider: Secondary | ICD-10-CM | POA: Insufficient documentation

## 2019-09-02 DIAGNOSIS — L089 Local infection of the skin and subcutaneous tissue, unspecified: Secondary | ICD-10-CM | POA: Insufficient documentation

## 2019-09-02 DIAGNOSIS — R221 Localized swelling, mass and lump, neck: Secondary | ICD-10-CM | POA: Insufficient documentation

## 2019-09-02 DIAGNOSIS — R2231 Localized swelling, mass and lump, right upper limb: Secondary | ICD-10-CM | POA: Insufficient documentation

## 2019-09-02 DIAGNOSIS — R21 Rash and other nonspecific skin eruption: Secondary | ICD-10-CM | POA: Insufficient documentation

## 2019-09-02 NOTE — ED Notes (Signed)
No response to take to triage 

## 2019-09-02 NOTE — ED Triage Notes (Signed)
Pt reports that for about a week he has had small bumps on his arms, face and the back of his neck come up that does have some drainage. Painful.

## 2019-09-02 NOTE — ED Notes (Signed)
Pt called 3X for room placement. No answer. VS stable.

## 2019-09-02 NOTE — ED Triage Notes (Signed)
Pt c/o multiple small bumps on bilateral arms, face, neck.

## 2019-09-02 NOTE — ED Notes (Signed)
No response for triage.  

## 2019-09-02 NOTE — ED Triage Notes (Signed)
Pt c/o lesions to bilateral arms, denies IV drug use.

## 2019-09-03 ENCOUNTER — Emergency Department (HOSPITAL_BASED_OUTPATIENT_CLINIC_OR_DEPARTMENT_OTHER)
Admission: EM | Admit: 2019-09-03 | Discharge: 2019-09-04 | Disposition: A | Discharge status: Home / Self Care | Payer: Self-pay | Attending: Emergency Medicine | Admitting: Emergency Medicine

## 2019-09-03 ENCOUNTER — Other Ambulatory Visit: Payer: Self-pay

## 2019-09-03 ENCOUNTER — Encounter (HOSPITAL_BASED_OUTPATIENT_CLINIC_OR_DEPARTMENT_OTHER): Payer: Self-pay | Admitting: *Deleted

## 2019-09-03 DIAGNOSIS — L739 Follicular disorder, unspecified: Secondary | ICD-10-CM | POA: Insufficient documentation

## 2019-09-03 DIAGNOSIS — F17210 Nicotine dependence, cigarettes, uncomplicated: Secondary | ICD-10-CM | POA: Insufficient documentation

## 2019-09-03 LAB — COMPREHENSIVE METABOLIC PANEL
ALT: 17 U/L (ref 0–44)
AST: 17 U/L (ref 15–41)
Albumin: 3.5 g/dL (ref 3.5–5.0)
Alkaline Phosphatase: 71 U/L (ref 38–126)
Anion gap: 10 (ref 5–15)
BUN: 17 mg/dL (ref 6–20)
CO2: 29 mmol/L (ref 22–32)
Calcium: 8.9 mg/dL (ref 8.9–10.3)
Chloride: 100 mmol/L (ref 98–111)
Creatinine, Ser: 0.97 mg/dL (ref 0.61–1.24)
GFR calc Af Amer: >60 mL/min (ref >60)
GFR calc non Af Amer: >60 mL/min (ref >60)
Glucose, Bld: 115 mg/dL — ABNORMAL HIGH (ref 70–99)
Potassium: 4.1 mmol/L (ref 3.5–5.1)
Sodium: 139 mmol/L (ref 135–145)
Total Bilirubin: 0.7 mg/dL (ref 0.3–1.2)
Total Protein: 7 g/dL (ref 6.5–8.1)

## 2019-09-03 LAB — CBC WITH DIFFERENTIAL/PLATELET
Abs Immature Granulocytes: 0.03 10*3/uL (ref 0.00–0.07)
Basophils Absolute: 0.1 10*3/uL (ref 0.0–0.1)
Basophils Relative: 1 %
Eosinophils Absolute: 0.9 10*3/uL — ABNORMAL HIGH (ref 0.0–0.5)
Eosinophils Relative: 9 %
HCT: 45.6 % (ref 39.0–52.0)
Hemoglobin: 14.3 g/dL (ref 13.0–17.0)
Immature Granulocytes: 0 %
Lymphocytes Relative: 23 %
Lymphs Abs: 2.1 10*3/uL (ref 0.7–4.0)
MCH: 30.3 pg (ref 26.0–34.0)
MCHC: 31.4 g/dL (ref 30.0–36.0)
MCV: 96.6 fL (ref 80.0–100.0)
Monocytes Absolute: 0.9 10*3/uL (ref 0.1–1.0)
Monocytes Relative: 9 %
Neutro Abs: 5.4 10*3/uL (ref 1.7–7.7)
Neutrophils Relative %: 58 %
Platelets: 309 10*3/uL (ref 150–400)
RBC: 4.72 MIL/uL (ref 4.22–5.81)
RDW: 12.9 % (ref 11.5–15.5)
WBC: 9.4 10*3/uL (ref 4.0–10.5)
nRBC: 0 % (ref 0.0–0.2)

## 2019-09-03 MED ORDER — SULFAMETHOXAZOLE-TRIMETHOPRIM 800-160 MG PO TABS: 1.0000 | ORAL_TABLET | Freq: Two times a day (BID) | ORAL | 0 refills | Status: DC

## 2019-09-03 MED ORDER — SULFAMETHOXAZOLE-TRIMETHOPRIM 800-160 MG PO TABS
2.0000 | ORAL_TABLET | Freq: Once | ORAL | Status: AC
  Administered 2019-09-04: 2 via ORAL
  Filled 2019-09-03: qty 2

## 2019-09-03 NOTE — ED Notes (Signed)
Pt called 3 times no response  

## 2019-09-03 NOTE — ED Provider Notes (Signed)
[facility] Provider Note   CSN: [id number] Arrival date & time: 09/03/19  2044     History Chief Complaint  Patient presents with  . Rash    Walter Miller is a 39 y.o. male.  Patient presents to the emergency department for evaluation of rash on his arms.  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  In the last few days he has noticed tender red bumps on his arms.  2 of the people he has been working closely with have had similar problems recently.        Past Medical History:  Diagnosis Date  . GERD (gastroesophageal reflux disease)   . No pertinent past medical history     Patient Active Problem List   Diagnosis Date Noted  . GERD (gastroesophageal reflux disease) 04/20/2012  . Gastric ulcer 04/20/2012    Past Surgical History:  Procedure Laterality Date  . NO PAST SURGERIES         History reviewed. No pertinent family history.  Social History   Tobacco Use  . Smoking status: Heavy Tobacco Smoker    Packs/day: 1.00    Types: Cigarettes  . Smokeless tobacco: Never Used  Substance Use Topics  . Alcohol use: No  . Drug use: No    Home Medications Prior to Admission medications   Medication Sig Start Date End Date Taking? Authorizing Provider  cyclobenzaprine (FLEXERIL) 10 MG tablet Take 1 tablet (10 mg total) by mouth 2 (two) times daily as needed for muscle spasms. 04/16/18   Maudie Flakes, MD  ondansetron (ZOFRAN ODT) 4 MG disintegrating tablet Take 1 tablet (4 mg total) by mouth every 8 (eight) hours as needed for nausea or vomiting. 04/16/18   Maudie Flakes, MD  sulfamethoxazole-trimethoprim (BACTRIM DS) 800-160 MG tablet Take 1 tablet by mouth 2 (two) times daily. 09/03/19   Orpah Greek, MD    Allergies    Bee venom  Review of Systems   Review of Systems  Constitutional: Negative for fever.  Skin: Positive for rash.  All other systems reviewed and are  negative.   Physical Exam Updated Vital Signs BP 126/78 (BP Location: Right Arm)   Pulse 97   Temp 98.7 F (37.1 C)   Resp 16   Ht 5\' 8"  (1.727 m)   Wt 83.9 kg   SpO2 100%   BMI 28.13 kg/m   Physical Exam Vitals and nursing note reviewed.  Constitutional:      General: He is not in acute distress.    Appearance: Normal appearance. He is well-developed.  HENT:     Head: Normocephalic and atraumatic.     Right Ear: Hearing normal.     Left Ear: Hearing normal.     Nose: Nose normal.  Eyes:     Conjunctiva/sclera: Conjunctivae normal.     Pupils: Pupils are equal, round, and reactive to light.  Cardiovascular:     Rate and Rhythm: Regular rhythm.     Heart sounds: S1 normal and S2 normal. No murmur. No friction rub. No gallop.   Pulmonary:     Effort: Pulmonary effort is normal. No respiratory distress.     Breath sounds: Normal breath sounds.  Chest:     Chest wall: No tenderness.  Abdominal:     General: Bowel sounds are normal.     Palpations: Abdomen is soft.     Tenderness: There is  no abdominal tenderness. There is no guarding or rebound. Negative signs include Murphy's sign and McBurney's sign.     Hernia: No hernia is present.  Musculoskeletal:        General: Normal range of motion.     Cervical back: Normal range of motion and neck supple.  Skin:    General: Skin is warm and dry.     Findings: Rash (Scattered raised erythematous tender papules with excoriations present) present.  Neurological:     Mental Status: He is alert and oriented to person, place, and time.     GCS: GCS eye subscore is 4. GCS verbal subscore is 5. GCS motor subscore is 6.     Cranial Nerves: No cranial nerve deficit.     Sensory: No sensory deficit.     Coordination: Coordination normal.  Psychiatric:        Speech: Speech normal.        Behavior: Behavior normal.        Thought Content: Thought content normal.     ED Results / Procedures / Treatments   Labs (all labs  ordered are listed, but only abnormal results are displayed) Labs Reviewed - No data to display  EKG None  Radiology No results found.  Procedures Procedures (including critical care time)  Medications Ordered in ED Medications  sulfamethoxazole-trimethoprim (BACTRIM DS) 800-160 MG per tablet 2 tablet (has no administration in time range)    ED Course  I have reviewed the triage vital signs and the nursing notes.  Pertinent labs & imaging results that were available during my care of the patient were reviewed by me and considered in my medical decision making (see chart for details).    MDM Rules/Calculators/A&P                      Patient with multiple lesions on his arms that are consistent with a folliculitis.  He has had multiple soft tissue skin infections in the past.  He apparently took some amoxicillin that he had leftover recently but it did not help.  Patient will be treated with Bactrim empirically.  He appears well.  No fever.  He denies IV drug use.  No murmurs auscultated on exam.  All joints are normal range of motion without erythema or warmth.  Final Clinical Impression(s) / ED Diagnoses Final diagnoses:  Folliculitis    Rx / DC Orders ED Discharge Orders         Ordered    sulfamethoxazole-trimethoprim (BACTRIM DS) 800-160 MG tablet  2 times daily     09/03/19 2355           Gilda Crease, MD 09/03/19 2355

## 2019-09-03 NOTE — ED Triage Notes (Addendum)
C/o lesions to bil arms , denies iv drug use, been at [facility] x2 today and [facility] x 1 today with cbc cmp resulted

## 2019-11-16 ENCOUNTER — Other Ambulatory Visit: Payer: Self-pay

## 2019-11-16 ENCOUNTER — Encounter (HOSPITAL_COMMUNITY): Payer: Self-pay | Admitting: Emergency Medicine

## 2019-11-16 ENCOUNTER — Emergency Department (HOSPITAL_COMMUNITY)
Admission: EM | Admit: 2019-11-16 | Discharge: 2019-11-16 | Disposition: A | Discharge status: Home / Self Care | Payer: Self-pay | Attending: Emergency Medicine | Admitting: Emergency Medicine

## 2019-11-16 DIAGNOSIS — R21 Rash and other nonspecific skin eruption: Secondary | ICD-10-CM | POA: Insufficient documentation

## 2019-11-16 DIAGNOSIS — R112 Nausea with vomiting, unspecified: Secondary | ICD-10-CM | POA: Insufficient documentation

## 2019-11-16 DIAGNOSIS — Z5321 Procedure and treatment not carried out due to patient leaving prior to being seen by health care provider: Secondary | ICD-10-CM | POA: Insufficient documentation

## 2019-11-16 LAB — CBC
HCT: 44.5 % (ref 39.0–52.0)
Hemoglobin: 14.2 g/dL (ref 13.0–17.0)
MCH: 30.3 pg (ref 26.0–34.0)
MCHC: 31.9 g/dL (ref 30.0–36.0)
MCV: 94.9 fL (ref 80.0–100.0)
Platelets: 268 10*3/uL (ref 150–400)
RBC: 4.69 MIL/uL (ref 4.22–5.81)
RDW: 12.7 % (ref 11.5–15.5)
WBC: 7 10*3/uL (ref 4.0–10.5)
nRBC: 0 % (ref 0.0–0.2)

## 2019-11-16 LAB — COMPREHENSIVE METABOLIC PANEL
ALT: 15 U/L (ref 0–44)
AST: 18 U/L (ref 15–41)
Albumin: 3.6 g/dL (ref 3.5–5.0)
Alkaline Phosphatase: 71 U/L (ref 38–126)
Anion gap: 10 (ref 5–15)
BUN: 14 mg/dL (ref 6–20)
CO2: 31 mmol/L (ref 22–32)
Calcium: 9.5 mg/dL (ref 8.9–10.3)
Chloride: 99 mmol/L (ref 98–111)
Creatinine, Ser: 1.3 mg/dL — ABNORMAL HIGH (ref 0.61–1.24)
GFR calc Af Amer: >60 mL/min (ref >60)
GFR calc non Af Amer: >60 mL/min (ref >60)
Glucose, Bld: 94 mg/dL (ref 70–99)
Potassium: 3.9 mmol/L (ref 3.5–5.1)
Sodium: 140 mmol/L (ref 135–145)
Total Bilirubin: 1.1 mg/dL (ref 0.3–1.2)
Total Protein: 7 g/dL (ref 6.5–8.1)

## 2019-11-16 LAB — URINALYSIS, ROUTINE W REFLEX MICROSCOPIC
Bilirubin Urine: NEGATIVE
Glucose, UA: NEGATIVE mg/dL
Hgb urine dipstick: NEGATIVE
Ketones, ur: NEGATIVE mg/dL
Nitrite: NEGATIVE
Protein, ur: NEGATIVE mg/dL
Specific Gravity, Urine: 1.018 (ref 1.005–1.030)
pH: 7 (ref 5.0–8.0)

## 2019-11-16 LAB — LIPASE, BLOOD: Lipase: 29 U/L (ref 11–51)

## 2019-11-16 MED ORDER — SODIUM CHLORIDE 0.9% FLUSH: 3.0000 mL | Freq: Once | INTRAVENOUS | Status: DC

## 2019-11-16 NOTE — ED Triage Notes (Signed)
Pt c/o rash to bilateral arms, and nausea/vomiting x 1 month.

## 2019-11-18 ENCOUNTER — Encounter (HOSPITAL_COMMUNITY): Payer: Self-pay | Admitting: Emergency Medicine

## 2019-11-18 ENCOUNTER — Emergency Department (HOSPITAL_COMMUNITY)
Admission: EM | Admit: 2019-11-18 | Discharge: 2019-11-19 | Disposition: A | Discharge status: Home / Self Care | Payer: Self-pay | Attending: Emergency Medicine | Admitting: Emergency Medicine

## 2019-11-18 ENCOUNTER — Other Ambulatory Visit: Payer: Self-pay

## 2019-11-18 DIAGNOSIS — R21 Rash and other nonspecific skin eruption: Secondary | ICD-10-CM | POA: Insufficient documentation

## 2019-11-18 DIAGNOSIS — F17210 Nicotine dependence, cigarettes, uncomplicated: Secondary | ICD-10-CM | POA: Insufficient documentation

## 2019-11-18 DIAGNOSIS — Z79899 Other long term (current) drug therapy: Secondary | ICD-10-CM | POA: Insufficient documentation

## 2019-11-18 NOTE — ED Triage Notes (Signed)
Patient reports skin irritation at left forearm after spraying cleaning solution at work this week , presents with redness " burning" at left forearm /mild swelling .

## 2019-11-19 MED ORDER — DOXYCYCLINE HYCLATE 100 MG PO CAPS
100.0000 mg | ORAL_CAPSULE | Freq: Two times a day (BID) | ORAL | 0 refills | Status: DC
Start: 2019-11-19 — End: 2019-11-26

## 2019-11-19 NOTE — ED Notes (Signed)
L forearm and neck wound irrigated and covered.

## 2019-11-19 NOTE — Discharge Planning (Signed)
Transition of Care [facility]) - Emergency Department Mini Assessment   Patient Details  Name: Walter Miller MRN: [id number] Date of Birth: 08-31-1980  Transition of Care [facility]) CM/SW Contact:    Oletta Cohn, RN Phone Number: 11/19/2019, 2:43 PM   Clinical Narrative: Rolla Flatten. Lucretia Roers, RN, BSN, [place] [phone]  RNCM set up appointment with Primary Care at [facility] on 11/5 @0930 .  Spoke with pt at bedside and advised to please arrive 15 min early and take a picture ID and your current medications.  Pt verbalizes understanding of keeping appointment.    ED Mini Assessment: What brought you to the Emergency Department? : injury at work  Barriers to Discharge: ED Barriers Resolved  Barrier interventions: referred pt to several agencies for housing via [place] [facility]  Means of departure: Car  Interventions which prevented an admission or readmission: [email or web address] Referral for , Follow-up medical appointment    Patient Contact and Communications        ,                 Admission diagnosis:  Infection in Blood  Patient Active Problem List   Diagnosis Date Noted  . GERD (gastroesophageal reflux disease) 04/20/2012  . Gastric ulcer 04/20/2012   PCP:  Patient, No Pcp Per Pharmacy:   [id number] - 06/18/2012, [place] - [address] [place] Phone: [phone] Fax: [phone]

## 2019-11-19 NOTE — Discharge Instructions (Addendum)
Homeless Resources [place]   [facility] [phone] Call for shelter availability.  You must have access to a phone so they can call you back.  [facility] [address] [place] [phone] M-F 8:00-3:00; S-S 8:00-2:00  [facility] (Men and Women) [address] [place] [phone]  [facility] (Men and Women) [address]  [phone]  [facility] (Dom[facility] Violence [facility]) [address] [place] [phone]  [facility] (Men) [address] [place] [phone]  [facility] (Single women and women with children) [address] [place] [phone]      

## 2019-11-19 NOTE — ED Notes (Signed)
Pt no longer in room. No one observed pt leaving. Unable to give pt DC instructions with CW services provided on AVS.

## 2019-11-19 NOTE — ED Notes (Signed)
Called patient for vital update patient didn't answer 

## 2019-11-19 NOTE — ED Provider Notes (Signed)
[facility] EMERGENCY DEPARTMENT Provider Note   CSN: [id number] Arrival date & time: 11/18/19  1950     History Chief Complaint  Patient presents with   Skin Irritation : Chemical Spray at Work    Walter Miller is a 39 y.o. male with past medical history significant for GERD.  HPI Presents to emergency department today with chief complaint of skin irritation x1 day.  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  He thinks he might have got some accidentally on his left forearm because he noticed a rash this morning.  He denies any associated pruritus.  He is also concerned that his living conditions are unsanitary.  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  He did have a prescription for Keflex x3 months ago, no other recent antibiotic use. Denies fever, chills, contacts with persons with similar rash, or any changes in lotions/soaps/detergents, exposure to animal or plant irritants, and denies swelling or purulent discharge. No new medications.  No recent travel. No recent tick bites. No involvement to palms/soles or between webspaces. Patient does not have history of  immunocompromised. UTD on immunizations.       Past Medical History:  Diagnosis Date   GERD (gastroesophageal reflux disease)    No pertinent past medical history     Patient Active Problem List   Diagnosis Date Noted   GERD (gastroesophageal reflux disease) 04/20/2012   Gastric ulcer 04/20/2012    Past Surgical History:  Procedure Laterality Date   NO PAST SURGERIES         No family history on file.  Social History   Tobacco Use   Smoking status: Heavy Tobacco Smoker    Packs/day: 1.00    Types: Cigarettes   Smokeless tobacco: Never Used  Substance Use Topics   Alcohol use: No   Drug use: No    Home Medications Prior to Admission  medications   Medication Sig Start Date End Date Taking? Authorizing Provider  cyclobenzaprine (FLEXERIL) 10 MG tablet Take 1 tablet (10 mg total) by mouth 2 (two) times daily as needed for muscle spasms. 04/16/18   Sabas Sous, MD  doxycycline (VIBRAMYCIN) 100 MG capsule Take 1 capsule (100 mg total) by mouth 2 (two) times daily for 7 days. 11/19/19 11/26/19  Lourdez Mcgahan E, PA-C  ondansetron (ZOFRAN ODT) 4 MG disintegrating tablet Take 1 tablet (4 mg total) by mouth every 8 (eight) hours as needed for nausea or vomiting. 04/16/18   Sabas Sous, MD    Allergies    Bee venom  Review of Systems   Review of Systems  Skin: Positive for rash.  All other systems reviewed and are negative.   Physical Exam Updated Vital Signs BP 104/65 (BP Location: Right Arm)    Pulse 79    Temp 98 F (36.7 C) (Oral)    Resp 18    Ht 5\' 9"  (1.753 m)    Wt 82 kg    SpO2 100%    BMI 26.70 kg/m   Physical Exam Vitals and nursing note reviewed.  Constitutional:      Appearance: He is well-developed. He is not ill-appearing or toxic-appearing.     Comments: Airways intact.  No stridor.  No angioedema  HENT:     Head: Normocephalic and atraumatic.     Nose: Nose normal.  Eyes:  General: No scleral icterus.       Right eye: No discharge.        Left eye: No discharge.     Conjunctiva/sclera: Conjunctivae normal.  Neck:     Vascular: No JVD.  Cardiovascular:     Rate and Rhythm: Normal rate and regular rhythm.     Pulses: Normal pulses.     Heart sounds: Normal heart sounds.  Pulmonary:     Effort: Pulmonary effort is normal.     Breath sounds: Normal breath sounds.  Abdominal:     General: There is no distension.  Musculoskeletal:        General: Normal range of motion.     Cervical back: Normal range of motion.     Comments: Compartments soft in left upper extremity.  He is neurovascularly intact distally.  Full range of motion of left shoulder, elbow and wrist.  Skin:    General:  Skin is warm and dry.     Findings: Rash present.     Comments: Erythema noted on dorsal aspect of left forearm.  There are 2 small open wounds with purulent drainage, less than 0.5 and cm in diameter.  No active bleeding.  Forearm is warm to the touch.  No track marks seen on extremities.  Neurological:     Mental Status: He is oriented to person, place, and time.     GCS: GCS eye subscore is 4. GCS verbal subscore is 5. GCS motor subscore is 6.     Comments: Fluent speech, no facial droop.  Strong equal strength in bilateral upper extremities.  Psychiatric:        Behavior: Behavior normal.     ED Results / Procedures / Treatments   Labs (all labs ordered are listed, but only abnormal results are displayed) Labs Reviewed - No data to display  EKG None  Radiology No results found.  Procedures Procedures (including critical care time)  Medications Ordered in ED Medications - No data to display  ED Course  I have reviewed the triage vital signs and the nursing notes.  Pertinent labs & imaging results that were available during my care of the patient were reviewed by me and considered in my medical decision making (see chart for details).    MDM Rules/Calculators/A&P                          39 year old male presenting with rash. Patient denies any difficulty breathing or swallowing.  Pt has a patent airway without stridor and is handling secretions without difficulty; Patient has a rash on his arm after using a cleaning product.  Concern for contact dermatitis versus cellulitis.  Exam without blisters, no pustules, no warmth, no draining sinus tracts, no superficial abscesses, no bullous impetigo, no vesicles, no desquamation, no target lesions with dusky purpura or a central bulla. Not tender to touch. Compartments are soft in left upper extremity. Radial pulse 2+ bilaterally. No signs of septic joint. .  Patient denies any IV drug use and I did not visualize any track  marks on exam.  Last antibiotic use was x3 months ago.  No concern for SJS, 10, TSS, tick borne illness, syphilis or other life-threatening condition. Will cover for possible infection with doxycycline.  Recommend over-the-counter Benadryl if needed. Patient is very concerned about his living situation.  Social work contacted in evaluate patient in the emergency room and were able to give patient resources.  Also assisted  in scheduling outpatient follow-up appointment with a PCP.    Patient left the department before receiving discharge paperwork.  I was informed of his departure after he had already gone.  I did not see him in the room or in the lobby to give paperwork to do.  I did discuss return precautions with his significant other in the room prior to his departure however.   Portions of this note were generated with Scientist, clinical (histocompatibility and immunogenetics). Dictation errors may occur despite best attempts at proofreading.     Final Clinical Impression(s) / ED Diagnoses Final diagnoses:  Rash    Rx / DC Orders ED Discharge Orders         Ordered    doxycycline (VIBRAMYCIN) 100 MG capsule  2 times daily     Discontinue  Reprint     11/19/19 1451           Kathyrn Lass 11/19/19 1549    Linwood Dibbles, MD 11/20/19 [phone]

## 2019-11-22 ENCOUNTER — Other Ambulatory Visit: Payer: Self-pay

## 2019-11-22 ENCOUNTER — Encounter (HOSPITAL_COMMUNITY): Payer: Self-pay | Admitting: Emergency Medicine

## 2019-11-22 ENCOUNTER — Emergency Department (HOSPITAL_COMMUNITY)
Admission: EM | Admit: 2019-11-22 | Discharge: 2019-11-22 | Disposition: A | Discharge status: Home / Self Care | Payer: Self-pay | Attending: Emergency Medicine | Admitting: Emergency Medicine

## 2019-11-22 DIAGNOSIS — Z5321 Procedure and treatment not carried out due to patient leaving prior to being seen by health care provider: Secondary | ICD-10-CM | POA: Insufficient documentation

## 2019-11-22 DIAGNOSIS — R1110 Vomiting, unspecified: Secondary | ICD-10-CM | POA: Insufficient documentation

## 2019-11-22 NOTE — ED Notes (Signed)
PT went to lobby to speak with wife

## 2019-11-22 NOTE — ED Triage Notes (Signed)
Patient states he has been vomiting and he has magots in his vomit.

## 2019-11-26 ENCOUNTER — Ambulatory Visit
Admission: EM | Admit: 2019-11-26 | Discharge: 2019-11-26 | Disposition: A | Discharge status: Home / Self Care | Payer: Self-pay | Attending: Physician Assistant | Admitting: Physician Assistant

## 2019-11-26 DIAGNOSIS — G8929 Other chronic pain: Secondary | ICD-10-CM

## 2019-11-26 DIAGNOSIS — R1110 Vomiting, unspecified: Secondary | ICD-10-CM

## 2019-11-26 DIAGNOSIS — K5900 Constipation, unspecified: Secondary | ICD-10-CM

## 2019-11-26 MED ORDER — SUCRALFATE 1 G PO TABS
1.0000 g | ORAL_TABLET | Freq: Three times a day (TID) | ORAL | 0 refills | Status: AC
Start: 2019-11-26 — End: ?

## 2019-11-26 NOTE — Discharge Instructions (Signed)
Start carafate as directed. Add over the counter omeprazole 20mg  daily. You can use over the counter miralax to help with bowel movement, can collect stool sample for to test and determine worms/parasites. Follow up with PCP/GI if symptoms not improving. Any worsening symptoms, vomiting blood, worsening chest pain, go to the emergency department for further evaluation.

## 2019-11-26 NOTE — ED Provider Notes (Signed)
[facility]    CSN: [id number] Arrival date & time: 11/26/19  0807      History   Chief Complaint Chief Complaint  Patient presents with  . Chest Pain    HPI Walter Miller is a 39 y.o. male.   39 year old male comes in for 3 month history of vomiting, left sided chest pain radiating down left arm with shortness of breath. Patient at first stated he started having chest pain/shortness of breath 2-3 hours prior to arrival. However, upon further questioning, patient states has been intermittent for "awhile". Chest pain is intermittent, left sided without aggravating or alleviating factor. States shortness of breath not directed related to the chest pain. No associated nausea/vomiting, diaphoresis. Denies personal heart history. No known family heart history. States due to history of 3 month vomiting, has not had any activity and unknown exertional symptoms.   States vomiting started 3 months ago after eating take out. Now have been able to tolerate oral intake for the past 2-3 weeks. Has not had BM for the past week. Still passing flatus. Abdominal pain that moves around. No change with oral intake. Subjective fever. Denies abdominal surgeries. Patient states has felt worms crawling under the skin/body and have seen worms in his vomiting and stools. States feeling crawling "everywhere". History of heroin use with IV drug use. States has not used drugs in the past week.     Past Medical History:  Diagnosis Date  . GERD (gastroesophageal reflux disease)   . No pertinent past medical history     Patient Active Problem List   Diagnosis Date Noted  . GERD (gastroesophageal reflux disease) 04/20/2012  . Gastric ulcer 04/20/2012    Past Surgical History:  Procedure Laterality Date  . NO PAST SURGERIES         Home Medications    Prior to Admission medications   Medication Sig Start Date End Date Taking? Authorizing Provider  sucralfate (CARAFATE) 1 g tablet Take  1 tablet (1 g total) by mouth 4 (four) times daily -  with meals and at bedtime. 11/26/19   Ok Edwards, PA-C    Family History History reviewed. No pertinent family history.  Social History Social History   Tobacco Use  . Smoking status: Heavy Tobacco Smoker    Packs/day: 1.00    Types: Cigarettes  . Smokeless tobacco: Never Used  Substance Use Topics  . Alcohol use: No  . Drug use: Not Currently     Allergies   Bee venom   Review of Systems Review of Systems  Reason unable to perform ROS: See HPI as above.     Physical Exam Triage Vital Signs ED Triage Vitals  Enc Vitals Group     BP 11/26/19 0818 (!) 146/96     Pulse Rate 11/26/19 0818 95     Resp 11/26/19 0818 18     Temp 11/26/19 0818 98.1 F (36.7 C)     Temp Source 11/26/19 0818 Oral     SpO2 11/26/19 0818 98 %     Weight --      Height --      Head Circumference --      Peak Flow --      Pain Score 11/26/19 0819 7     Pain Loc --      Pain Edu? --      Excl. in [place]? --    No data found.  Updated Vital Signs BP (!) 146/96 (BP  Location: Left Arm)   Pulse 95   Temp 98.1 F (36.7 C) (Oral)   Resp 18   SpO2 98%   Physical Exam Constitutional:      General: He is not in acute distress.    Appearance: Normal appearance. He is not toxic-appearing or diaphoretic.  HENT:     Head: Normocephalic and atraumatic.  Eyes:     Conjunctiva/sclera: Conjunctivae normal.     Pupils: Pupils are equal, round, and reactive to light.  Cardiovascular:     Rate and Rhythm: Normal rate and regular rhythm.  Pulmonary:     Effort: Pulmonary effort is normal. No respiratory distress.     Comments: LCTAB Chest:     Comments: No obvious tenderness to palpation of left chest. When asked, patient stated can feel worms coming out. No obvious lesions noted. No rashes.  Abdominal:     General: Bowel sounds are normal.     Palpations: Abdomen is soft.     Tenderness: There is abdominal tenderness in the epigastric area.  There is no guarding or rebound.     Comments: Pain can be distractible.   Musculoskeletal:     Cervical back: Normal range of motion and neck supple.  Skin:    General: Skin is warm and dry.     Comments: Left arm wrapped with ace wrap. When removed, scabbing to the left forearm without active drainage, erythema, warmth. No other rashes seen.  Neurological:     Mental Status: He is alert and oriented to person, place, and time.    UC Treatments / Results  Labs (all labs ordered are listed, but only abnormal results are displayed) Labs Reviewed - No data to display  EKG   Radiology No results found.  Procedures Procedures (including critical care time)  Medications Ordered in UC Medications - No data to display  Initial Impression / Assessment and Plan / UC Course  I have reviewed the triage vital signs and the nursing notes.  Pertinent labs & imaging results that were available during my care of the patient were reviewed by me and considered in my medical decision making (see chart for details).    EKG NSR, 89bpm, no acute ST changes. Has had symptoms for "awhile", low suspicion for ACS. Given epigastric pain with vomiting, history of gastric ulcer, will treat with omeprazole and carafate. Patient worries for worms in the stool/vomit. Will provide kit for stool sample, will send for testing. Return precautions given. Otherwise patient will need to follow up with PCP/GI for further evaluation needed.  Final Clinical Impressions(s) / UC Diagnoses   Final diagnoses:  Chronic vomiting  Abdominal pain, chronic, generalized  Constipation, unspecified constipation type    ED Prescriptions    Medication Sig Dispense Auth. Provider   sucralfate (CARAFATE) 1 g tablet Take 1 tablet (1 g total) by mouth 4 (four) times daily -  with meals and at bedtime. 20 tablet Ok Edwards, PA-C     PDMP not reviewed this encounter.   Ok Edwards, PA-C 11/26/19 1145

## 2019-11-26 NOTE — ED Triage Notes (Signed)
Pt c/o vomiting x3 months after eating at [employer]. States has been seen multiple times in the ER with no help. States hasn't vomit or has a BM in over a week. States has been having lt sided chest pain radiating down lt arm with SOB since 6am. No distress noted at this time.

## 2019-12-01 ENCOUNTER — Encounter (HOSPITAL_COMMUNITY): Payer: Self-pay

## 2019-12-01 ENCOUNTER — Other Ambulatory Visit: Payer: Self-pay

## 2019-12-01 ENCOUNTER — Emergency Department (HOSPITAL_COMMUNITY)
Admission: EM | Admit: 2019-12-01 | Discharge: 2019-12-01 | Disposition: A | Discharge status: Home / Self Care | Payer: Self-pay | Attending: Emergency Medicine | Admitting: Emergency Medicine

## 2019-12-01 DIAGNOSIS — R109 Unspecified abdominal pain: Secondary | ICD-10-CM | POA: Insufficient documentation

## 2019-12-01 DIAGNOSIS — Z5321 Procedure and treatment not carried out due to patient leaving prior to being seen by health care provider: Secondary | ICD-10-CM | POA: Insufficient documentation

## 2019-12-01 LAB — COMPREHENSIVE METABOLIC PANEL
ALT: 20 U/L (ref 0–44)
AST: 24 U/L (ref 15–41)
Albumin: 4.5 g/dL (ref 3.5–5.0)
Alkaline Phosphatase: 65 U/L (ref 38–126)
Anion gap: 11 (ref 5–15)
BUN: 17 mg/dL (ref 6–20)
CO2: 28 mmol/L (ref 22–32)
Calcium: 10.1 mg/dL (ref 8.9–10.3)
Chloride: 100 mmol/L (ref 98–111)
Creatinine, Ser: 1.26 mg/dL — ABNORMAL HIGH (ref 0.61–1.24)
GFR calc Af Amer: >60 mL/min (ref >60)
GFR calc non Af Amer: >60 mL/min (ref >60)
Glucose, Bld: 93 mg/dL (ref 70–99)
Potassium: 3.9 mmol/L (ref 3.5–5.1)
Sodium: 139 mmol/L (ref 135–145)
Total Bilirubin: 1.3 mg/dL — ABNORMAL HIGH (ref 0.3–1.2)
Total Protein: 8.1 g/dL (ref 6.5–8.1)

## 2019-12-01 LAB — CBC
HCT: 46.8 % (ref 39.0–52.0)
Hemoglobin: 15.5 g/dL (ref 13.0–17.0)
MCH: 31.2 pg (ref 26.0–34.0)
MCHC: 33.1 g/dL (ref 30.0–36.0)
MCV: 94.2 fL (ref 80.0–100.0)
Platelets: 313 10*3/uL (ref 150–400)
RBC: 4.97 MIL/uL (ref 4.22–5.81)
RDW: 12.8 % (ref 11.5–15.5)
WBC: 12.3 10*3/uL — ABNORMAL HIGH (ref 4.0–10.5)
nRBC: 0 % (ref 0.0–0.2)

## 2019-12-01 LAB — LIPASE, BLOOD: Lipase: 28 U/L (ref 11–51)

## 2019-12-01 NOTE — ED Notes (Signed)
Pt called x3 for vital recheck no answer 

## 2019-12-01 NOTE — ED Triage Notes (Signed)
Patient complains of months-of abdominal discomfort and states all related to eating bad biscuit at [employer] months ago. Patient alert and oriented. States that he is vomiting worms occassionally

## 2019-12-01 NOTE — ED Notes (Signed)
Called patient to recheck V/S unable to locate patient at this time.

## 2019-12-04 ENCOUNTER — Emergency Department (HOSPITAL_COMMUNITY)
Admission: EM | Admit: 2019-12-04 | Discharge: 2019-12-04 | Disposition: A | Discharge status: Home / Self Care | Payer: Self-pay | Attending: Emergency Medicine | Admitting: Emergency Medicine

## 2019-12-04 ENCOUNTER — Emergency Department (HOSPITAL_COMMUNITY): Payer: Self-pay

## 2019-12-04 ENCOUNTER — Encounter (HOSPITAL_COMMUNITY): Payer: Self-pay | Admitting: Emergency Medicine

## 2019-12-04 ENCOUNTER — Other Ambulatory Visit: Payer: Self-pay

## 2019-12-04 DIAGNOSIS — L299 Pruritus, unspecified: Secondary | ICD-10-CM | POA: Insufficient documentation

## 2019-12-04 DIAGNOSIS — F17210 Nicotine dependence, cigarettes, uncomplicated: Secondary | ICD-10-CM | POA: Insufficient documentation

## 2019-12-04 DIAGNOSIS — R070 Pain in throat: Secondary | ICD-10-CM | POA: Insufficient documentation

## 2019-12-04 DIAGNOSIS — R238 Other skin changes: Secondary | ICD-10-CM

## 2019-12-04 DIAGNOSIS — Z79899 Other long term (current) drug therapy: Secondary | ICD-10-CM | POA: Insufficient documentation

## 2019-12-04 LAB — CBC WITH DIFFERENTIAL/PLATELET
Abs Immature Granulocytes: 0.01 10*3/uL (ref 0.00–0.07)
Basophils Absolute: 0.1 10*3/uL (ref 0.0–0.1)
Basophils Relative: 1 %
Eosinophils Absolute: 0.3 10*3/uL (ref 0.0–0.5)
Eosinophils Relative: 4 %
HCT: 44.7 % (ref 39.0–52.0)
Hemoglobin: 15 g/dL (ref 13.0–17.0)
Immature Granulocytes: 0 %
Lymphocytes Relative: 17 %
Lymphs Abs: 1.2 10*3/uL (ref 0.7–4.0)
MCH: 30.9 pg (ref 26.0–34.0)
MCHC: 33.6 g/dL (ref 30.0–36.0)
MCV: 92 fL (ref 80.0–100.0)
Monocytes Absolute: 0.5 10*3/uL (ref 0.1–1.0)
Monocytes Relative: 7 %
Neutro Abs: 5 10*3/uL (ref 1.7–7.7)
Neutrophils Relative %: 71 %
Platelets: 286 10*3/uL (ref 150–400)
RBC: 4.86 MIL/uL (ref 4.22–5.81)
RDW: 12.6 % (ref 11.5–15.5)
WBC: 7.1 10*3/uL (ref 4.0–10.5)
nRBC: 0 % (ref 0.0–0.2)

## 2019-12-04 LAB — COMPREHENSIVE METABOLIC PANEL
ALT: 20 U/L (ref 0–44)
AST: 23 U/L (ref 15–41)
Albumin: 4.3 g/dL (ref 3.5–5.0)
Alkaline Phosphatase: 66 U/L (ref 38–126)
Anion gap: 6 (ref 5–15)
BUN: 16 mg/dL (ref 6–20)
CO2: 30 mmol/L (ref 22–32)
Calcium: 9.6 mg/dL (ref 8.9–10.3)
Chloride: 98 mmol/L (ref 98–111)
Creatinine, Ser: 0.88 mg/dL (ref 0.61–1.24)
GFR calc Af Amer: >60 mL/min (ref >60)
GFR calc non Af Amer: >60 mL/min (ref >60)
Glucose, Bld: 106 mg/dL — ABNORMAL HIGH (ref 70–99)
Potassium: 3.6 mmol/L (ref 3.5–5.1)
Sodium: 134 mmol/L — ABNORMAL LOW (ref 135–145)
Total Bilirubin: 1 mg/dL (ref 0.3–1.2)
Total Protein: 7.8 g/dL (ref 6.5–8.1)

## 2019-12-04 MED ORDER — HYDROXYZINE HCL 25 MG PO TABS
25.0000 mg | ORAL_TABLET | Freq: Four times a day (QID) | ORAL | 0 refills | Status: AC | PRN
Start: 2019-12-04 — End: ?

## 2019-12-04 MED ORDER — FLUCONAZOLE 150 MG PO TABS
150.0000 mg | ORAL_TABLET | ORAL | 0 refills | Status: AC
Start: 2019-12-04 — End: 2020-01-02

## 2019-12-04 MED ORDER — HYDROXYZINE HCL 10 MG PO TABS
10.0000 mg | ORAL_TABLET | Freq: Once | ORAL | Status: AC
  Administered 2019-12-04: 10 mg via ORAL
  Filled 2019-12-04: qty 1

## 2019-12-04 NOTE — ED Triage Notes (Addendum)
Patient arrives with intermittent left sided abdominal pain x3 months. Patient was seen 8/22 for same. Patient was diagnosed with constipation and told to take miralax, which he has not.

## 2019-12-04 NOTE — Discharge Instructions (Signed)
As discussed, your evaluation today has been largely reassuring.  But, it is important that you monitor your condition carefully, and do not hesitate to return to the ED if you develop new, or concerning changes in your condition. ? ?Otherwise, please follow-up with your physician for appropriate ongoing care. ? ?

## 2019-12-04 NOTE — ED Notes (Signed)
Pt left before being discharged.

## 2019-12-04 NOTE — ED Notes (Signed)
Pt was getting re-vitalized by NT and was complaining about throat.  This RN went to assess patient, pt asking about spots on forehead as he believes he has warms. Pt stating that hands will go cold and arms will get hot and feet feel wet intermittently.  This RN did see any spots on forehead. Pt also saying that his throat is wider than it normally is. Pt stated that he will feel something come across his adam's apple area intermittently and believes he has worms.  O2 100% air way patent and not compromised at this time. Informed patient that he is longest wait and will be going back as soon as we get an available room.

## 2019-12-04 NOTE — ED Provider Notes (Signed)
[facility]-EMERGENCY DEPT Provider Note   CSN: [id number] Arrival date & time: 12/04/19  0454     History Chief Complaint  Patient presents with  . Abdominal Pain    Walter Miller is a 39 y.o. male.  HPI    Patient presents with concerns about several items. Patient denies medical problems, acknowledges cigarette use, snorting heroin occasionally. He notes that over the past month he has had sensation of migratory fullness, in his posterior neck, left shoulder, left thigh, with dysesthesia in these areas.  Now over the past 2 weeks he has developed a sensation of something in his throat, with discomfort primarily about the sternoclavicular notch, inconsistently worse, possibly with difficulty swallowing. There is associated decreased p.o. intake due to this discomfort. In addition, the patient notes lesions on both arms, and states that he has itchiness, some suspicion that there are worms crawling about. No relief with anything, including trying Clorox on left arm lesions, which resulted in change from hypopigmented areas to newly scabbed over lesions. He has been seen and evaluated at this facility 8 times in the past 6 months including today.  Most recently he had findings, he reports were consistent with folliculitis. Past Medical History:  Diagnosis Date  . GERD (gastroesophageal reflux disease)   . No pertinent past medical history     Patient Active Problem List   Diagnosis Date Noted  . GERD (gastroesophageal reflux disease) 04/20/2012  . Gastric ulcer 04/20/2012    Past Surgical History:  Procedure Laterality Date  . NO PAST SURGERIES         No family history on file.  Social History   Tobacco Use  . Smoking status: Heavy Tobacco Smoker    Packs/day: 1.00    Types: Cigarettes  . Smokeless tobacco: Never Used  Substance Use Topics  . Alcohol use: No  . Drug use: Not Currently    Home Medications Prior to Admission medications    Medication Sig Start Date End Date Taking? Authorizing Provider  Aspirin-Acetaminophen-Caffeine [id number] MG PACK Take 1 packet by mouth daily.   Yes [provider]  ibuprofen (ADVIL) 200 MG tablet Take 600 mg by mouth every 6 (six) hours as needed for moderate pain.    Yes [provider]  pseudoephedrine (SUDAFED CONGESTION) 30 MG tablet Take 30 mg by mouth every 4 (four) hours as needed for congestion.   Yes [provider]  fluconazole (DIFLUCAN) 150 MG tablet Take 1 tablet (150 mg total) by mouth once a week for 5 doses. 12/04/19 01/02/20  Gerhard Munch, MD  hydrOXYzine (ATARAX/VISTARIL) 25 MG tablet Take 1 tablet (25 mg total) by mouth every 6 (six) hours as needed for itching. 12/04/19   Gerhard Munch, MD  sucralfate (CARAFATE) 1 g tablet Take 1 tablet (1 g total) by mouth 4 (four) times daily -  with meals and at bedtime. Patient not taking: Reported on 12/04/2019 11/26/19   Belinda Fisher, PA-C    Allergies    Bee venom  Review of Systems   Review of Systems  Constitutional:       Per HPI, otherwise negative  HENT:       Per HPI, otherwise negative  Respiratory:       Per HPI, otherwise negative  Cardiovascular:       Per HPI, otherwise negative  Gastrointestinal: Negative for vomiting.  Endocrine:       Negative aside from HPI  Genitourinary:  Neg aside from HPI   Musculoskeletal:       Per HPI, otherwise negative  Skin: Positive for color change and rash.  Neurological: Negative for syncope.  Psychiatric/Behavioral: The patient is nervous/anxious.     Physical Exam Updated Vital Signs BP 118/71   Pulse 79   Temp 97.7 F (36.5 C) (Oral)   Resp 16   SpO2 100%   Physical Exam Vitals and nursing note reviewed.  Constitutional:      Comments: Thin adult male awake and alert, anxious  HENT:     Head: Normocephalic and atraumatic.     Mouth/Throat:     Mouth: Mucous membranes are moist. No oral lesions.     Dentition: No gingival  swelling.     Tongue: Tongue does not deviate from midline.     Pharynx: No pharyngeal swelling, oropharyngeal exudate, posterior oropharyngeal erythema or uvula swelling.  Eyes:     Conjunctiva/sclera: Conjunctivae normal.  Neck:   Cardiovascular:     Rate and Rhythm: Normal rate and regular rhythm.     Comments: Pulses normal Pulmonary:     Effort: Pulmonary effort is normal. No respiratory distress.     Breath sounds: No stridor.  Abdominal:     General: There is no distension.     Tenderness: There is no abdominal tenderness.  Skin:    General: Skin is warm and dry.     Comments: There are multiple areas of tattoos throughout the patient's upper extremities.  On the right arm there are multiple areas of hypopigmentation, scattered less than 1 cm each.  Left arm with several areas of excoriation.  Neurological:     Mental Status: He is alert and oriented to person, place, and time.     ED Results / Procedures / Treatments   Labs (all labs ordered are listed, but only abnormal results are displayed) Labs Reviewed  COMPREHENSIVE METABOLIC PANEL - Abnormal; Notable for the following components:      Result Value   Sodium 134 (*)    Glucose, Bld 106 (*)    All other components within normal limits  CBC WITH DIFFERENTIAL/PLATELET    EKG None  Radiology DG Chest Port 1 View  Result Date: 12/04/2019 CLINICAL DATA:  Discomfort at the level of the sternoclavicular notch. EXAM: PORTABLE CHEST 1 VIEW COMPARISON:  11/21/2016, 04/20/2012. FINDINGS: The heart size and mediastinal contours are within normal limits and similar to prior. Both lungs are clear. No pleural effusion. No discernible pneumothorax. No acute osseous abnormality. IMPRESSION: No acute cardiopulmonary disease. Electronically Signed   By: Feliberto Harts MD   On: 12/04/2019 09:34    Procedures Procedures (including critical care time)  Medications Ordered in ED Medications  hydrOXYzine (ATARAX/VISTARIL)  tablet 10 mg (10 mg Oral Given 12/04/19 0944)    ED Course  I have reviewed the triage vital signs and the nursing notes.  Pertinent labs & imaging results that were available during my care of the patient were reviewed by me and considered in my medical decision making (see chart for details).   10:58 AM 10:58 AM On repeat exam the patient is sleeping, in right lateral decubitus position, no distress. Labs reviewed, discussed, vital signs remain unremarkable. No evidence for acute abnormalities, some suspicion for tinea infection contributing to his pruritus, discoloration.  No evidence for hepatic abnormalities on labs, patient is appropriate for, amenable to ongoing outpatient therapy.  Final Clinical Impression(s) / ED Diagnoses Final diagnoses:  Skin irritation  Throat  pain    Rx / DC Orders ED Discharge Orders         Ordered    hydrOXYzine (ATARAX/VISTARIL) 25 MG tablet  Every 6 hours PRN        12/04/19 1035    fluconazole (DIFLUCAN) 150 MG tablet  Weekly        12/04/19 1035           Gerhard Munch, MD 12/04/19 1059

## 2019-12-17 ENCOUNTER — Inpatient Hospital Stay: Payer: Self-pay | Admitting: Nurse Practitioner

## 2019-12-17 ENCOUNTER — Ambulatory Visit: Payer: Self-pay | Admitting: *Deleted

## 2019-12-17 NOTE — Telephone Encounter (Signed)
Patient's wife is calling to report- days 5 -vomiting. They are reporting patient has parasite and they can see them in his skin and eye/nose.Patient can not keep fliuds down and has been constipated for weeks. Last urinated- yesterday.  Reason for Disposition . [1] SEVERE vomiting (e.g., 6 or more times/day) AND [2] present > 8 hours (Exception: patient sounds well, is drinking liquids, does not sound dehydrated, and vomiting has lasted less than 24 hours)  Answer Assessment - Initial Assessment Questions 1. VOMITING SEVERITY: "How many times have you vomited in the past 24 hours?"     - MILD:  1 - 2 times/day    - MODERATE: 3 - 5 times/day, decreased oral intake without significant weight loss or symptoms of dehydration    - SEVERE: 6 or more times/day, vomits everything or nearly everything, with significant weight loss, symptoms of dehydration      severe 2. ONSET: "When did the vomiting begin?"      5 days ago 3. FLUIDS: "What fluids or food have you vomited up today?" "Have you been able to keep any fluids down?"     Anything that he has sipped- cranberry juice, ice cubes- unable to keep anything down  4. ABDOMINAL PAIN: "Are your having any abdominal pain?" If yes : "How bad is it and what does it feel like?" (e.g., crampy, dull, intermittent, constant)      Yes- from side to side 5. DIARRHEA: "Is there any diarrhea?" If Yes, ask: "How many times today?"      no 6. CONTACTS: "Is there anyone else in the family with the same symptoms?"      no 7. CAUSE: "What do you think is causing your vomiting?"     parasite infection 8. HYDRATION STATUS: "Any signs of dehydration?" (e.g., dry mouth [not only dry lips], too weak to stand) "When did you last urinate?"     Yes- last urinated- yesterday 9. OTHER SYMPTOMS: "Do you have any other symptoms?" (e.g., fever, headache, vertigo, vomiting blood or coffee grounds, recent head injury)     Low grade fever, headache, dizziness 10. PREGNANCY: "Is  there any chance you are pregnant?" "When was your last menstrual period?"       n/a  Protocols used: [facility]

## 2020-01-05 ENCOUNTER — Emergency Department (HOSPITAL_COMMUNITY): Payer: Self-pay

## 2020-01-05 ENCOUNTER — Emergency Department (HOSPITAL_COMMUNITY)
Admission: EM | Admit: 2020-01-05 | Discharge: 2020-01-05 | Disposition: A | Discharge status: Home / Self Care | Payer: Self-pay | Attending: Emergency Medicine | Admitting: Emergency Medicine

## 2020-01-05 ENCOUNTER — Encounter (HOSPITAL_COMMUNITY): Payer: Self-pay | Admitting: Emergency Medicine

## 2020-01-05 ENCOUNTER — Other Ambulatory Visit: Payer: Self-pay

## 2020-01-05 DIAGNOSIS — K529 Noninfective gastroenteritis and colitis, unspecified: Secondary | ICD-10-CM | POA: Insufficient documentation

## 2020-01-05 DIAGNOSIS — Z7982 Long term (current) use of aspirin: Secondary | ICD-10-CM | POA: Insufficient documentation

## 2020-01-05 DIAGNOSIS — R1084 Generalized abdominal pain: Secondary | ICD-10-CM

## 2020-01-05 DIAGNOSIS — F17210 Nicotine dependence, cigarettes, uncomplicated: Secondary | ICD-10-CM | POA: Insufficient documentation

## 2020-01-05 LAB — URINALYSIS, ROUTINE W REFLEX MICROSCOPIC
Bacteria, UA: NONE SEEN
Bilirubin Urine: NEGATIVE
Glucose, UA: NEGATIVE mg/dL
Hgb urine dipstick: NEGATIVE
Ketones, ur: 20 mg/dL — AB
Leukocytes,Ua: NEGATIVE
Nitrite: NEGATIVE
Protein, ur: 30 mg/dL — AB
Specific Gravity, Urine: 1.032 — ABNORMAL HIGH (ref 1.005–1.030)
pH: 7 (ref 5.0–8.0)

## 2020-01-05 LAB — COMPREHENSIVE METABOLIC PANEL
ALT: 17 U/L (ref 0–44)
AST: 16 U/L (ref 15–41)
Albumin: 4 g/dL (ref 3.5–5.0)
Alkaline Phosphatase: 55 U/L (ref 38–126)
Anion gap: 11 (ref 5–15)
BUN: 13 mg/dL (ref 6–20)
CO2: 29 mmol/L (ref 22–32)
Calcium: 9.5 mg/dL (ref 8.9–10.3)
Chloride: 103 mmol/L (ref 98–111)
Creatinine, Ser: 0.94 mg/dL (ref 0.61–1.24)
GFR calc Af Amer: >60 mL/min (ref >60)
GFR calc non Af Amer: >60 mL/min (ref >60)
Glucose, Bld: 97 mg/dL (ref 70–99)
Potassium: 3.6 mmol/L (ref 3.5–5.1)
Sodium: 143 mmol/L (ref 135–145)
Total Bilirubin: 1.5 mg/dL — ABNORMAL HIGH (ref 0.3–1.2)
Total Protein: 7.6 g/dL (ref 6.5–8.1)

## 2020-01-05 LAB — LIPASE, BLOOD: Lipase: 30 U/L (ref 11–51)

## 2020-01-05 LAB — CBC
HCT: 47.1 % (ref 39.0–52.0)
Hemoglobin: 15.5 g/dL (ref 13.0–17.0)
MCH: 30.8 pg (ref 26.0–34.0)
MCHC: 32.9 g/dL (ref 30.0–36.0)
MCV: 93.5 fL (ref 80.0–100.0)
Platelets: 332 10*3/uL (ref 150–400)
RBC: 5.04 MIL/uL (ref 4.22–5.81)
RDW: 13.2 % (ref 11.5–15.5)
WBC: 10.7 10*3/uL — ABNORMAL HIGH (ref 4.0–10.5)
nRBC: 0 % (ref 0.0–0.2)

## 2020-01-05 MED ORDER — PROMETHAZINE HCL 25 MG/ML IJ SOLN: 25.0000 mg | Freq: Once | INTRAMUSCULAR | Status: DC

## 2020-01-05 MED ORDER — ONDANSETRON 4 MG PO TBDP: 4.0000 mg | ORAL_TABLET | Freq: Three times a day (TID) | ORAL | 0 refills | Status: AC | PRN

## 2020-01-05 MED ORDER — ONDANSETRON HCL 4 MG/2ML IJ SOLN
4.0000 mg | Freq: Once | INTRAMUSCULAR | Status: AC
  Administered 2020-01-05: 4 mg via INTRAVENOUS
  Filled 2020-01-05: qty 2

## 2020-01-05 MED ORDER — IOHEXOL 300 MG/ML  SOLN
100.0000 mL | Freq: Once | INTRAMUSCULAR | Status: AC | PRN
  Administered 2020-01-05: 100 mL via INTRAVENOUS

## 2020-01-05 MED ORDER — ALUM & MAG HYDROXIDE-SIMETH 200-200-20 MG/5ML PO SUSP
30.0000 mL | Freq: Once | ORAL | Status: AC
  Administered 2020-01-05: 30 mL via ORAL
  Filled 2020-01-05: qty 30

## 2020-01-05 MED ORDER — SODIUM CHLORIDE 0.9 % IV BOLUS
1000.0000 mL | Freq: Once | INTRAVENOUS | Status: AC
  Administered 2020-01-05: 1000 mL via INTRAVENOUS

## 2020-01-05 MED ORDER — LIDOCAINE VISCOUS HCL 2 % MT SOLN
15.0000 mL | Freq: Once | OROMUCOSAL | Status: AC
  Administered 2020-01-05: 15 mL via ORAL
  Filled 2020-01-05: qty 15

## 2020-01-05 MED ORDER — DROPERIDOL 2.5 MG/ML IJ SOLN
1.2500 mg | Freq: Once | INTRAMUSCULAR | Status: AC
  Administered 2020-01-05: 1.25 mg via INTRAVENOUS
  Filled 2020-01-05: qty 2

## 2020-01-05 NOTE — ED Notes (Signed)
Patient complains that he and another inmate have the same pain and it was due to an infection in his shoulder.  Patient complains of sores all over hiim and thinking he has the same thing.  Explained that he has not source of infection and it shouldn't have anything to do with his abdominal pain.
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
## 2020-01-05 NOTE — ED Notes (Signed)
Returned from CT.

## 2020-01-05 NOTE — ED Notes (Signed)
Patient again complaining of rash on skin that is ringworm.  Patient advised this is not what is causing his nausea.  Reports we dont know what we are talking about.  Advised all test results are negative.

## 2020-01-05 NOTE — ED Notes (Signed)
Unable to tolerate PO challenge.  Vomited graham crackers and soda.  MD notified

## 2020-01-05 NOTE — ED Notes (Signed)
Updated patient that we are waiting on CT. Patient covered up with blankets, sleeping and in no distress.
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
## 2020-01-05 NOTE — ED Notes (Signed)
Patient resting comfortably.  MD doesn't want to do phenergan due to patient being sleepy.  [facility] with jail

## 2020-01-05 NOTE — ED Notes (Signed)
Ambulatory with steady gait

## 2020-01-18 ENCOUNTER — Emergency Department (HOSPITAL_COMMUNITY)
Admission: EM | Admit: 2020-01-18 | Discharge: 2020-01-18 | Disposition: A | Discharge status: Home / Self Care | Payer: Self-pay | Attending: Emergency Medicine | Admitting: Emergency Medicine

## 2020-01-18 ENCOUNTER — Encounter (HOSPITAL_COMMUNITY): Payer: Self-pay

## 2020-01-18 ENCOUNTER — Other Ambulatory Visit: Payer: Self-pay

## 2020-01-18 DIAGNOSIS — Z5321 Procedure and treatment not carried out due to patient leaving prior to being seen by health care provider: Secondary | ICD-10-CM | POA: Insufficient documentation

## 2020-01-18 DIAGNOSIS — F1910 Other psychoactive substance abuse, uncomplicated: Secondary | ICD-10-CM | POA: Insufficient documentation

## 2020-01-18 NOTE — ED Notes (Signed)
Pt allowed triage to complete. Continues to refuse treatment and elopes from triage.
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
## 2020-02-14 ENCOUNTER — Telehealth: Payer: Self-pay | Admitting: Internal Medicine

## 2020-02-14 DIAGNOSIS — Z7689 Persons encountering health services in other specified circumstances: Secondary | ICD-10-CM

## 2022-01-30 IMAGING — DX DG CHEST 1V PORT
1 series · 1 of 1 positions shown · non-contrast
Comparison: 12/04/2019 and prior radiographs

CLINICAL DATA: Chest pain.

EXAM:
PORTABLE CHEST 1 VIEW

[chest]
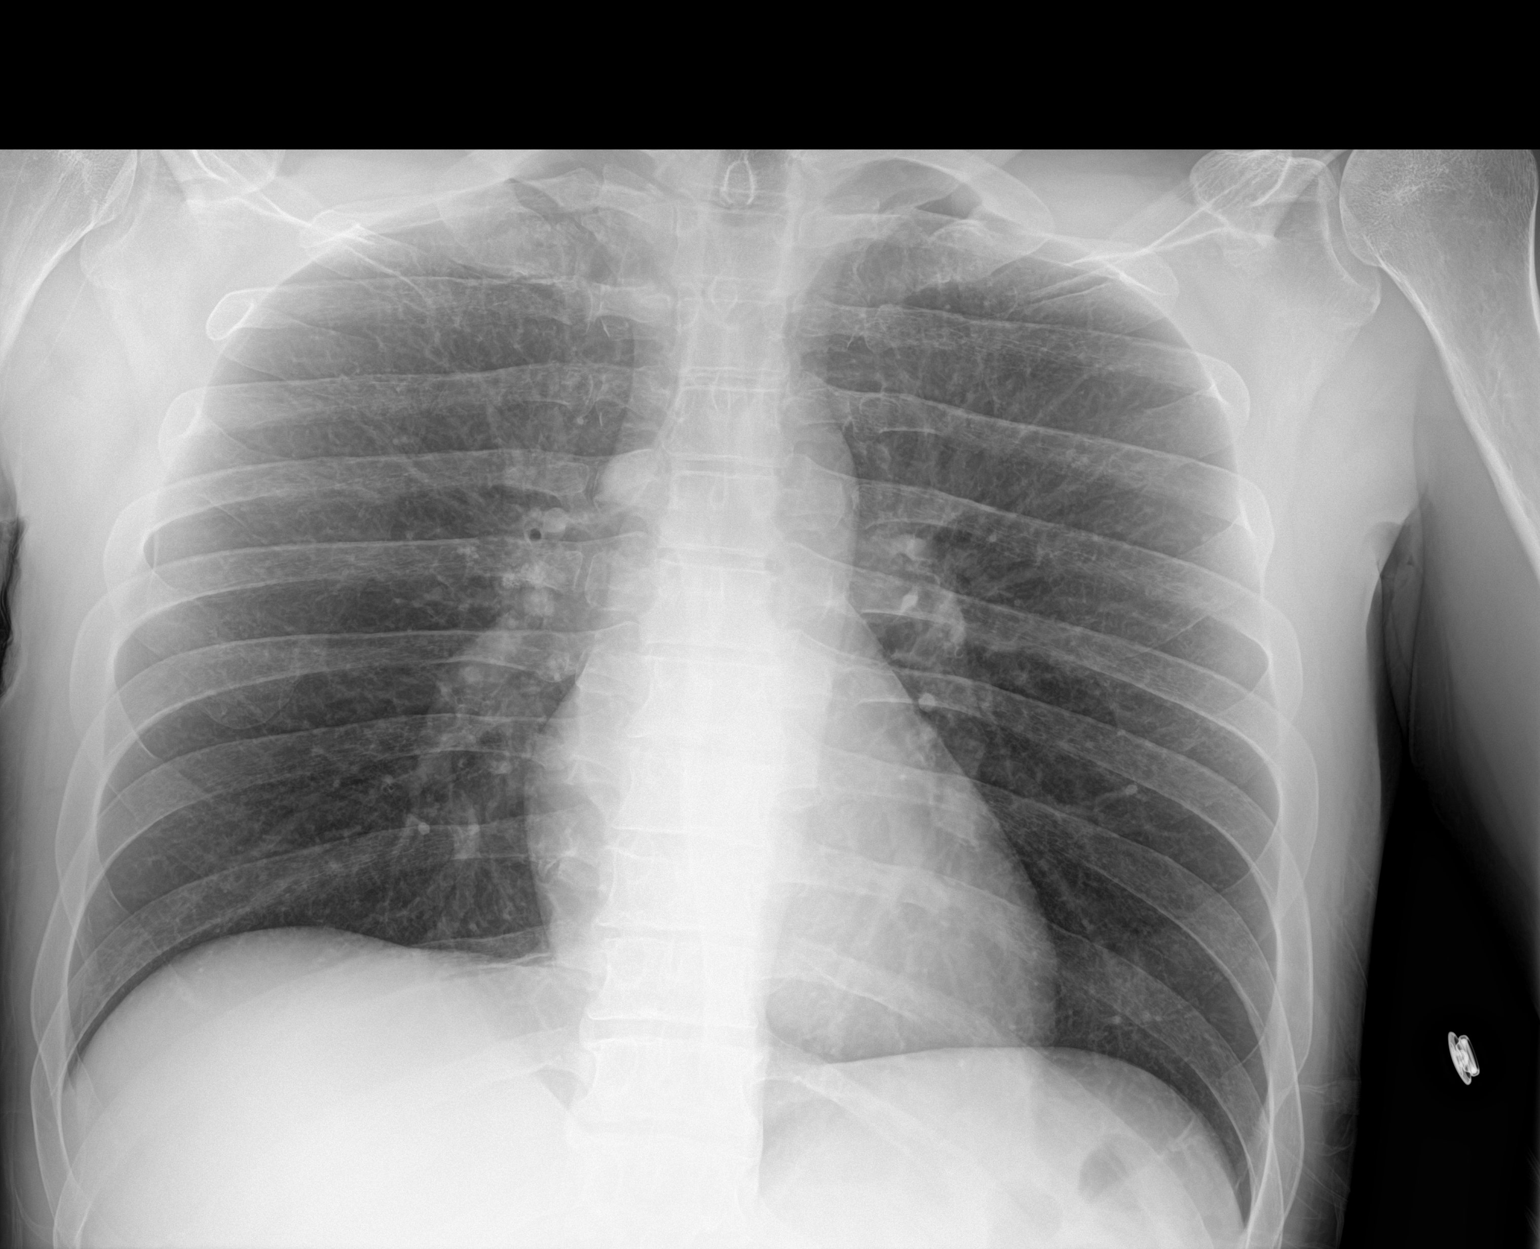

[1 of 1 positions shown; findings below may reference images not displayed]

FINDINGS: The cardiomediastinal silhouette is unremarkable.

There is no evidence of focal airspace disease, pulmonary edema,
suspicious pulmonary nodule/mass, pleural effusion, or pneumothorax.

No acute bony abnormalities are identified.
IMPRESSION: No active disease.

## 2022-01-30 IMAGING — CT CT ABD-PELV W/ CM
2 of 4 series · 16 of 46 positions shown, 18 images · IV contrast (APPLIED)
Comparison: 04/20/2012

CLINICAL DATA: Ostia and vomiting with acute abdominal pain.

EXAM:
CT ABDOMEN AND PELVIS WITH CONTRAST
TECHNIQUE: Multidetector CT imaging of the abdomen and pelvis was performed
using the standard protocol following bolus administration of
intravenous contrast.
CONTRAST:  100mL OMNIPAQUE IOHEXOL 300 MG/ML  SOLN

[Series 3: abdomen 5.0 · axial · 0.78mm/px · z∈[+684,+1079]mm · 13 of 89 slices shown, 15 images]
[im 5/89  soft-tissue]
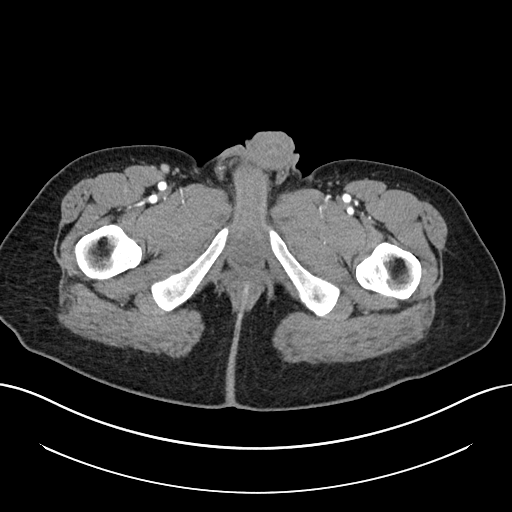
[im 5/89  bone]
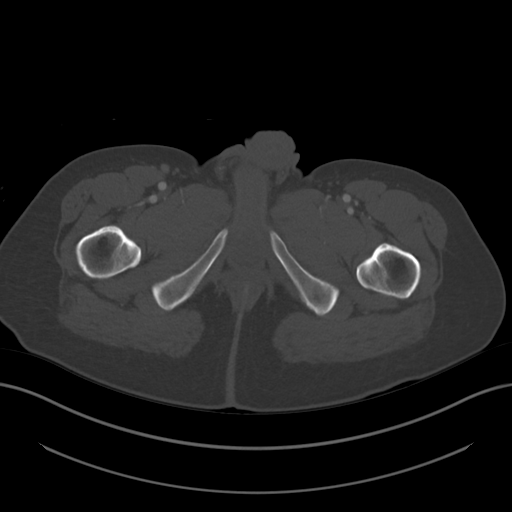
[im 14/89  soft-tissue]
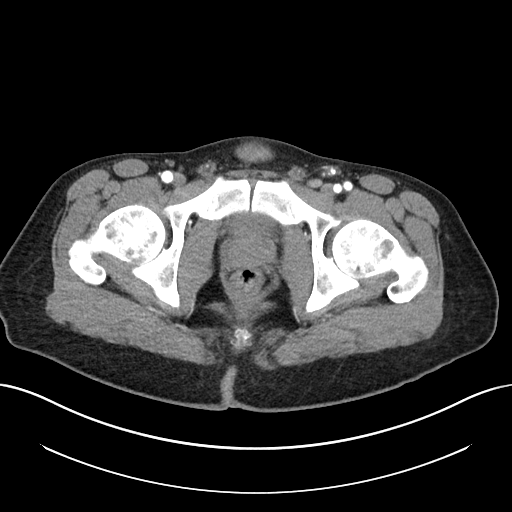
[im 19/89  soft-tissue]
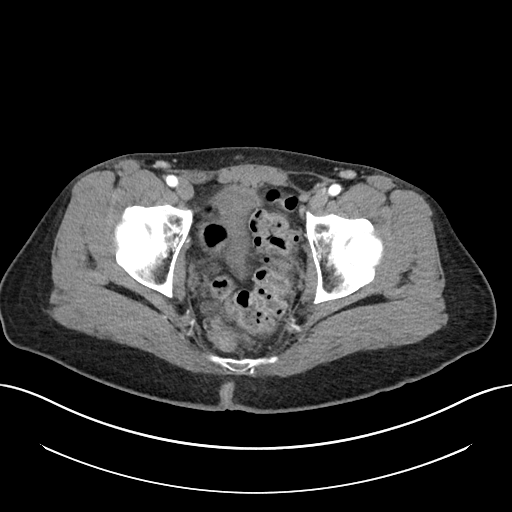
[im 24/89  soft-tissue]
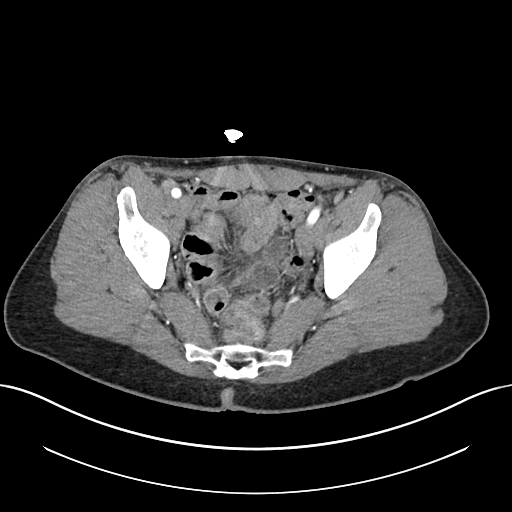
[im 33/89  soft-tissue]
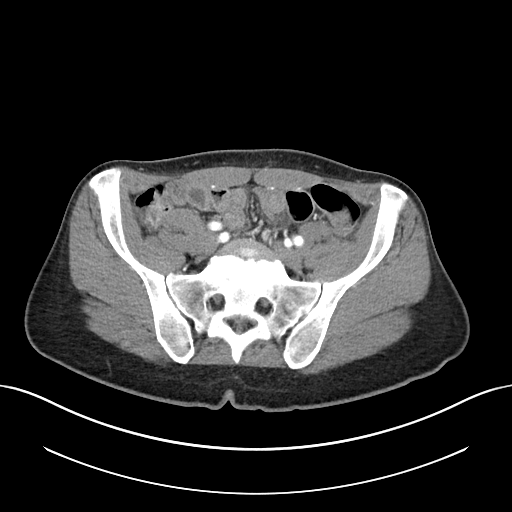
[im 38/89  soft-tissue]
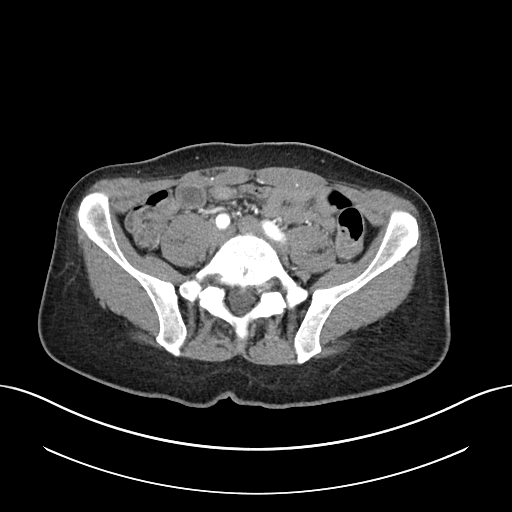
[im 47/89  soft-tissue]
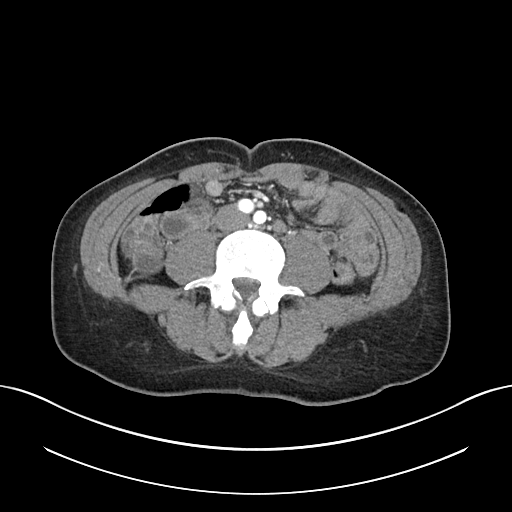
[im 51/89  soft-tissue]
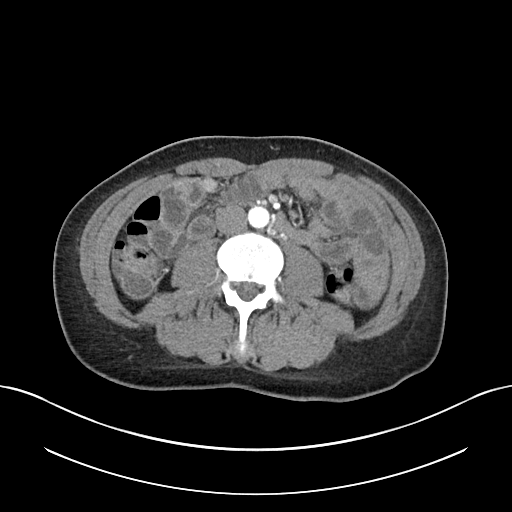
[im 56/89  soft-tissue]
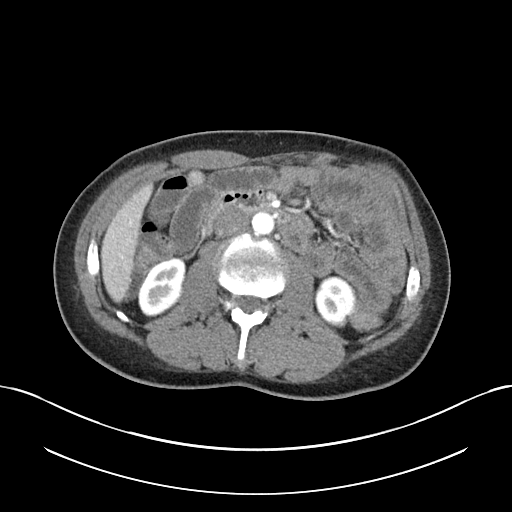
[im 56/89  bone]
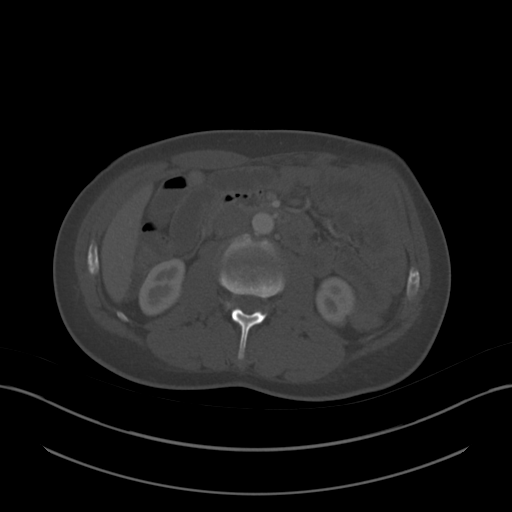
[im 65/89  soft-tissue]
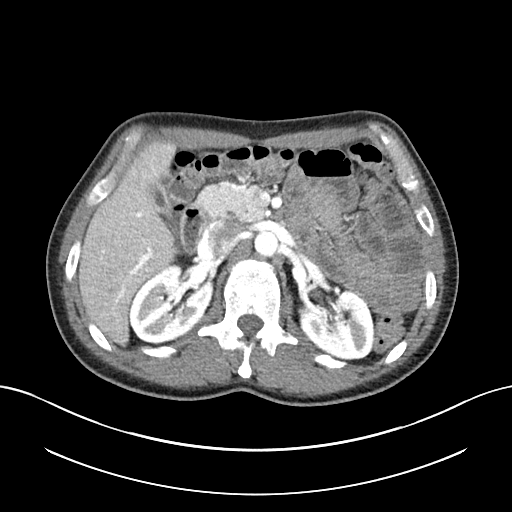
[im 70/89  soft-tissue]
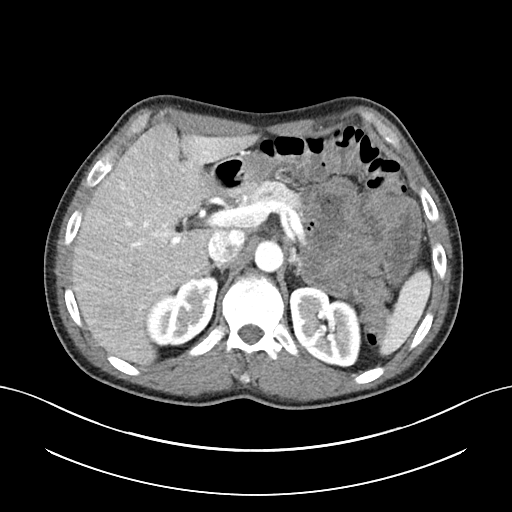
[im 75/89  soft-tissue]
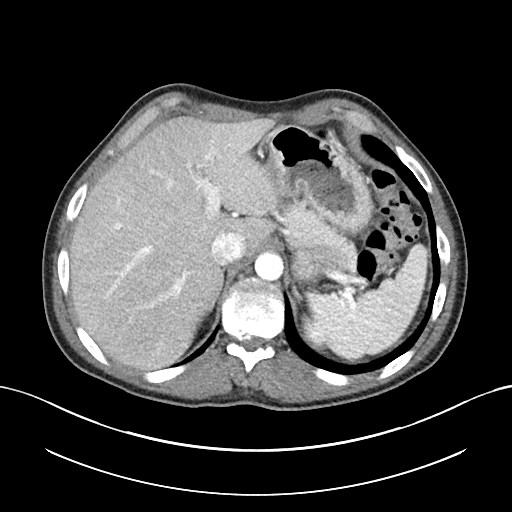
[im 84/89  soft-tissue]
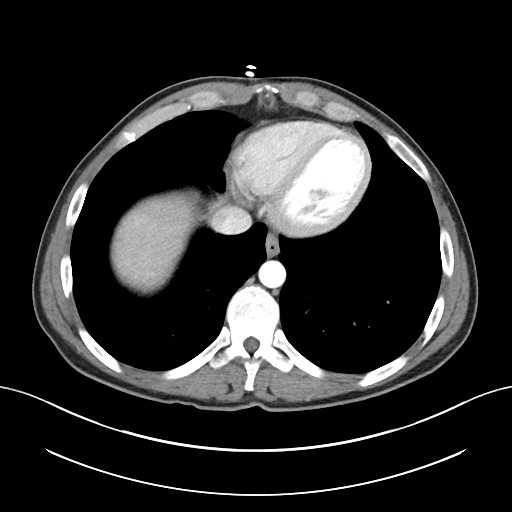

[Series 6: abdomen 3.0 mpr cor · coronal · 0.70mm/px · 3 of 79 slices shown]
[im 27/79  soft-tissue]
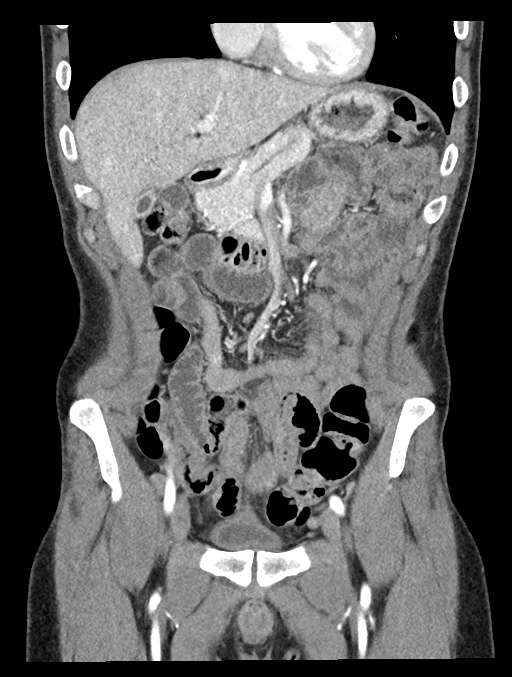
[im 35/79  soft-tissue]
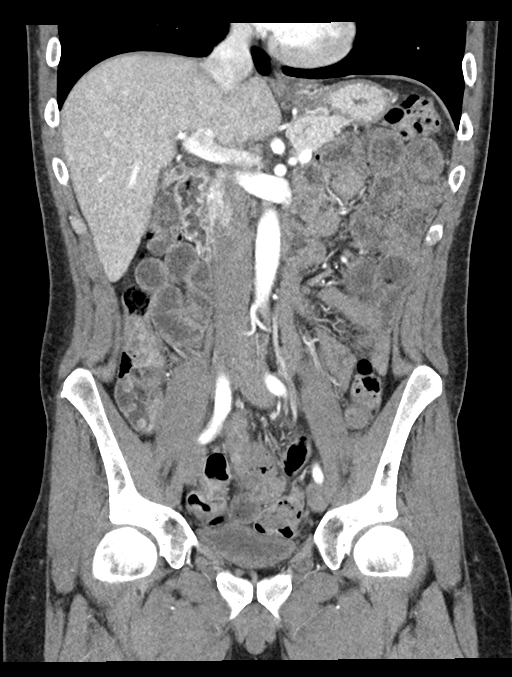
[im 44/79  soft-tissue]
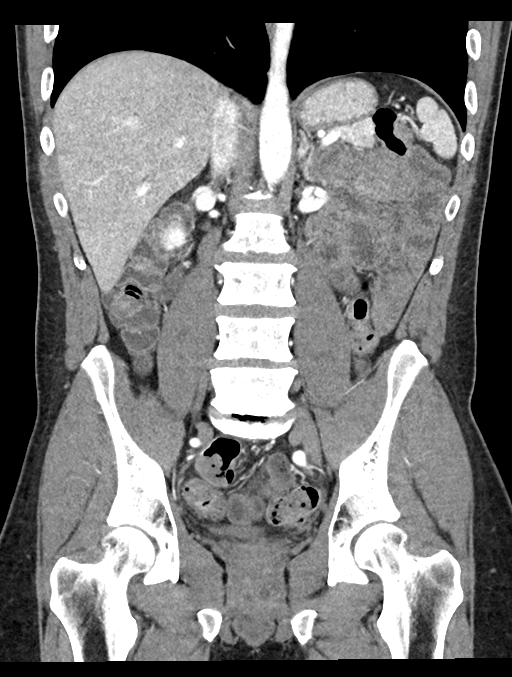

[16 of 46 positions shown; findings below may reference images not displayed]

FINDINGS: Lower chest:  No contributory findings.

Hepatobiliary: No focal liver abnormality.No evidence of biliary
obstruction or stone.

Pancreas: Unremarkable.

Spleen: Unremarkable.

Adrenals/Urinary Tract: Negative adrenals. No hydronephrosis or
ureteral stone. Early contrast excretion which limits detection of
calculi. Unremarkable bladder.

Stomach/Bowel: There are a few loops of mildly thickened small bowel
best seen in the lower abdomen with submucosal low-density edematous
appearance. No terminal ileitis. Colonic fluid levels are seen to
the transverse colon. No visible appendicitis.

Vascular/Lymphatic: No acute vascular abnormality. No mass or
adenopathy.

Reproductive:No pathologic findings.

Other: No ascites or pneumoperitoneum.

Musculoskeletal: No acute abnormalities. Disc degeneration that is
notable for age with a sizable right paracentral protrusion at
T11-12, central protrusion at L3-4 and advanced disc space narrowing
at L5-S1.
IMPRESSION: Nonspecific distal ileitis.
